# Patient Record
Sex: Female | Born: 1945 | Hispanic: No | Marital: Single
Health system: Midwestern US, Academic
[De-identification: ages and names within clinical notes are randomized; demographics above are authoritative.]

---

## 2019-01-16 IMAGING — CT CT ABDOMEN AND PELVIS WITHOUT CONTRAST
2 of 3 series · 16 of 46 positions shown, 18 images · non-contrast
Comparison: There are no previous exams available for comparison.

CT ABDOMEN AND PELVIS WITHOUT CONTRAST, 01/16/2019 [DATE]: 
CLINICAL INDICATION:  Abdominal bloating. Abdominal pain. Bladder cancer 
diagnosed 6630. Removed tumor. Radiation and chemotherapy. Hysterectomy. COPD. 
A search for DICOM formatted images was conducted for prior CT imaging studies 
completed at a non-affiliated media free facility.
TECHNIQUE: The abdomen and pelvis were scanned from lung bases through the 
pubic rami without contrast on a high-resolution CT scanner using dose reduction 
techniques..  Routine MPR reconstructions were performed.

[Series 2: abd/pel ax wo · axial · 0.88mm/px · z∈[-497,-98]mm · 13 of 153 slices shown, 15 images]
[im 10/153  soft-tissue]
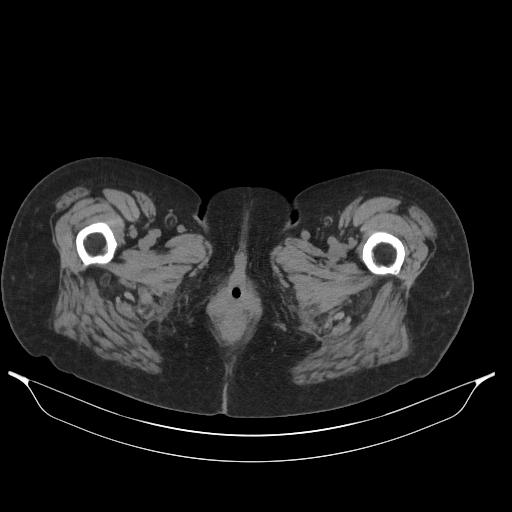
[im 10/153  bone]
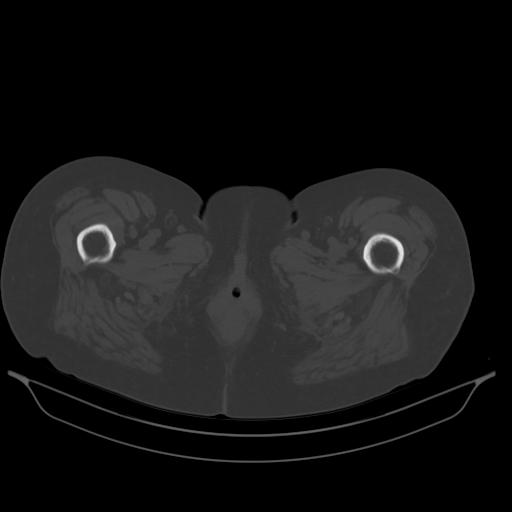
[im 20/153  soft-tissue]
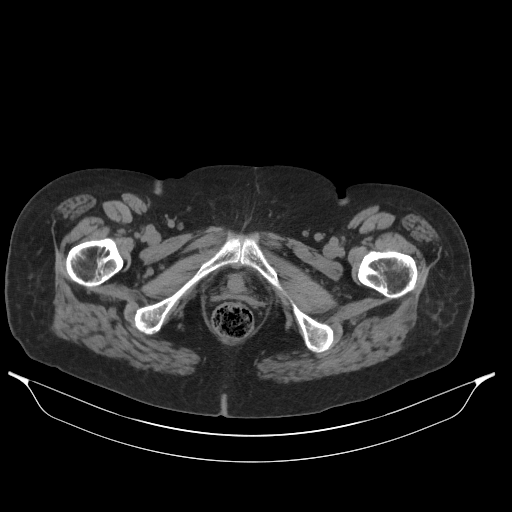
[im 30/153  soft-tissue]
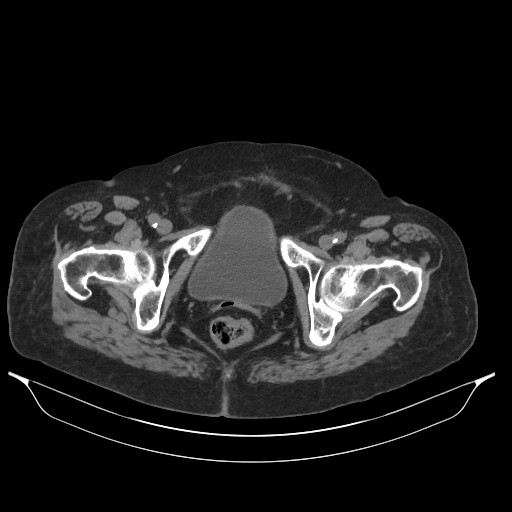
[im 45/153  soft-tissue]
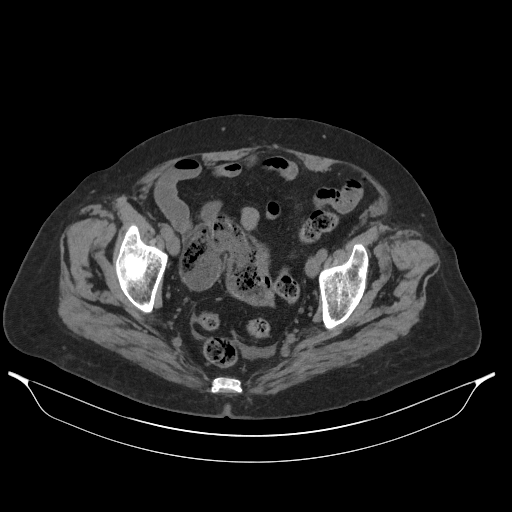
[im 54/153  soft-tissue]
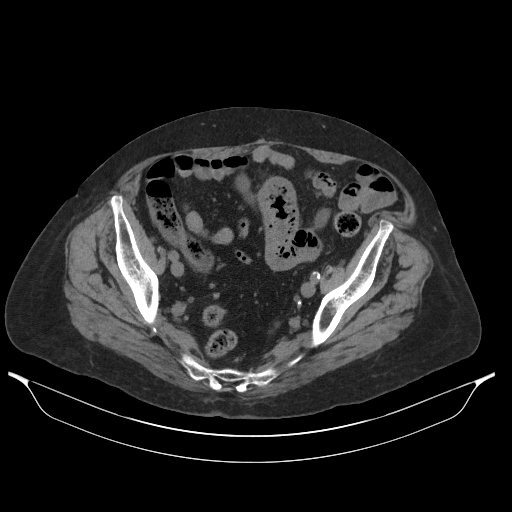
[im 64/153  soft-tissue]
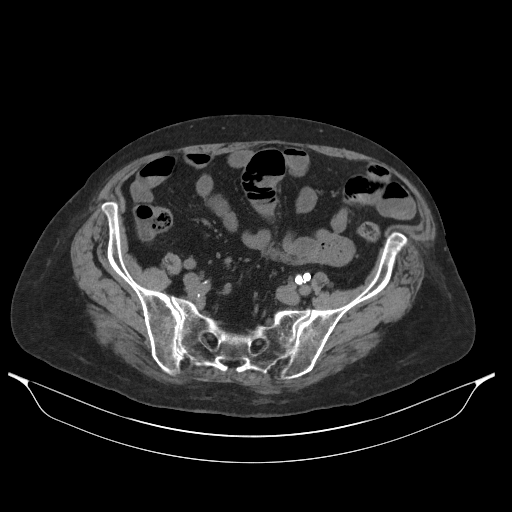
[im 79/153  soft-tissue]
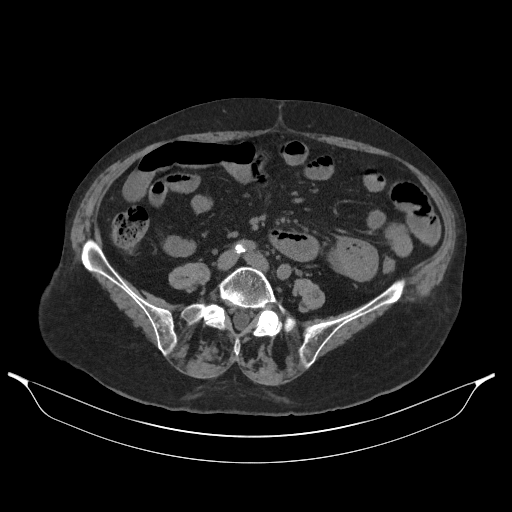
[im 89/153  soft-tissue]
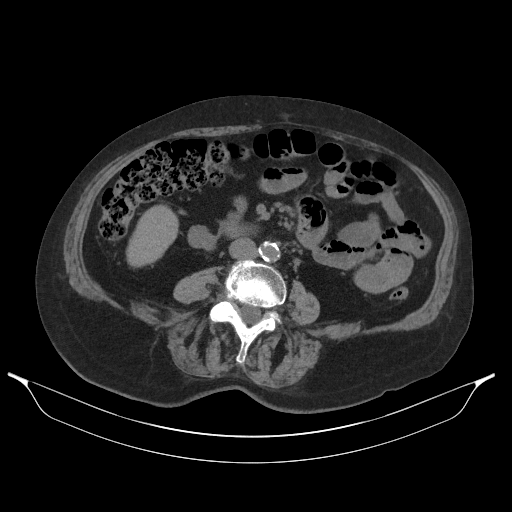
[im 99/153  soft-tissue]
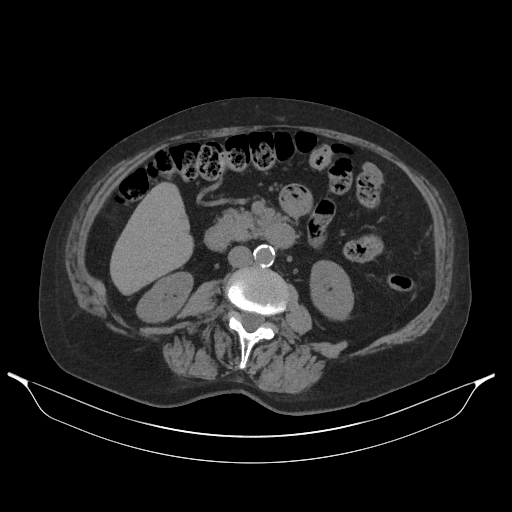
[im 99/153  bone]
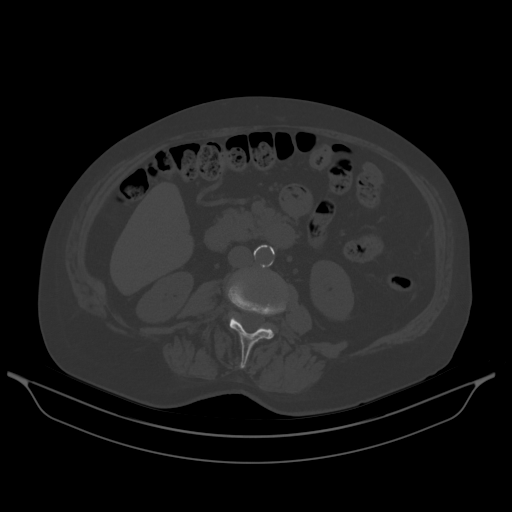
[im 108/153  soft-tissue]
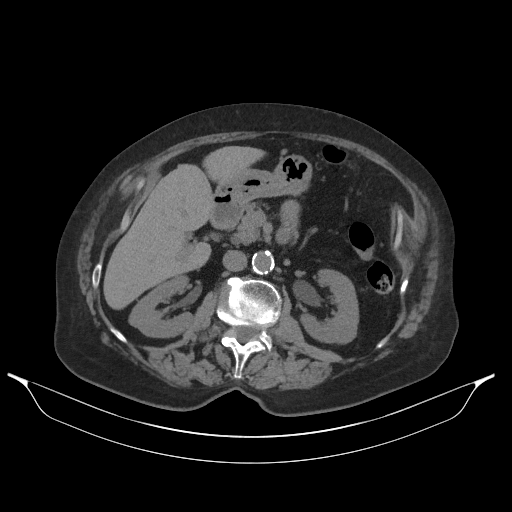
[im 123/153  soft-tissue]
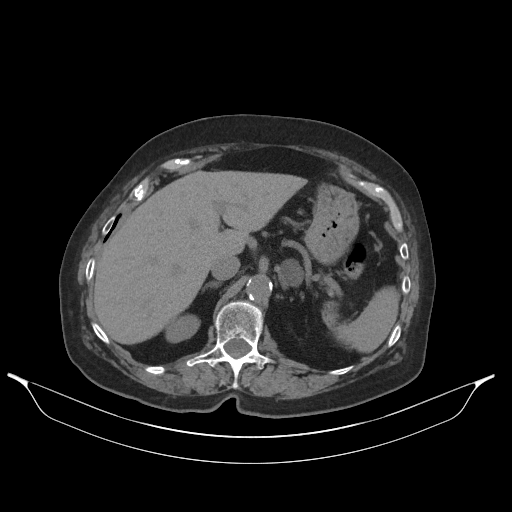
[im 133/153  soft-tissue]
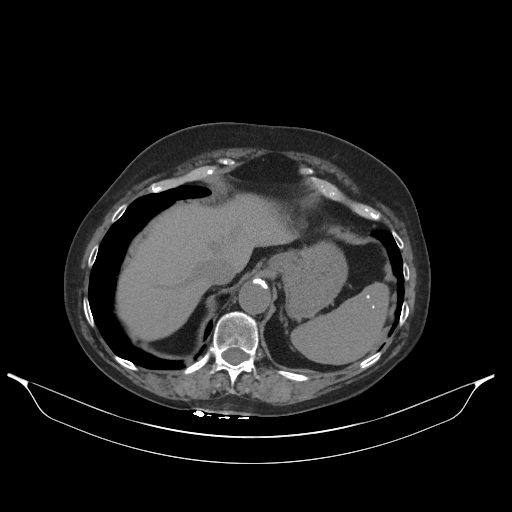
[im 143/153  soft-tissue]
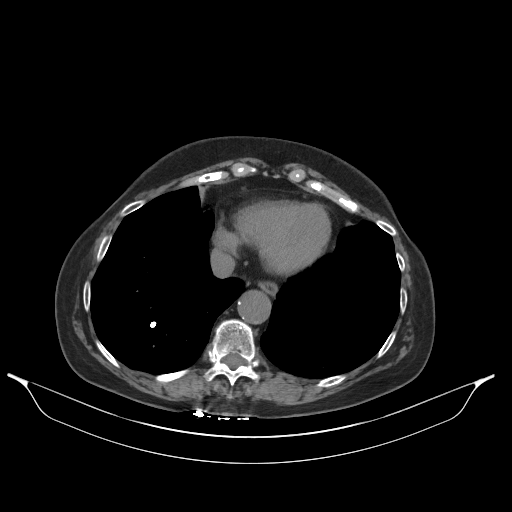

[Series 3: abd/pel cor wo · coronal · 0.88mm/px · 3 of 133 slices shown]
[im 45/133  soft-tissue]
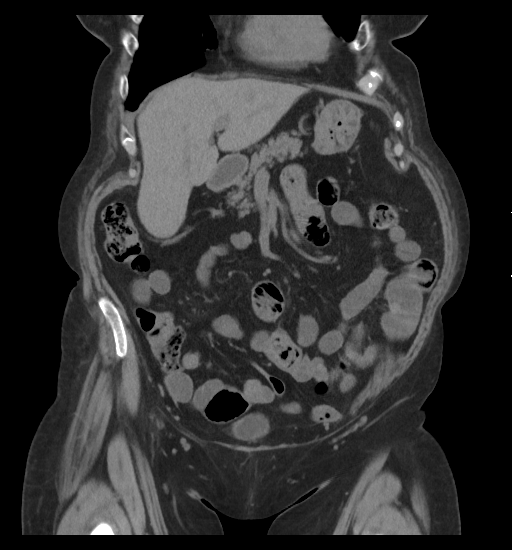
[im 59/133  soft-tissue]
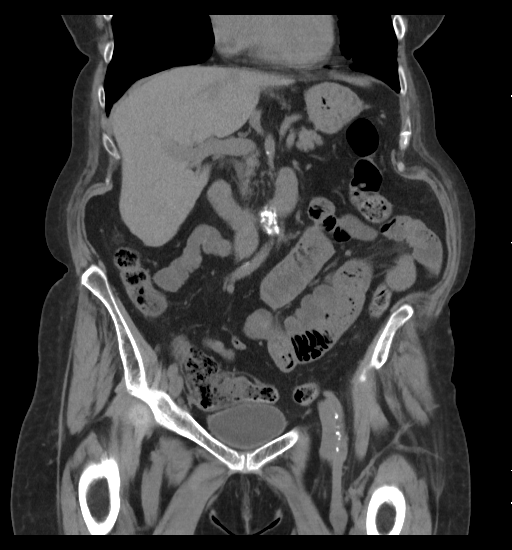
[im 74/133  soft-tissue]
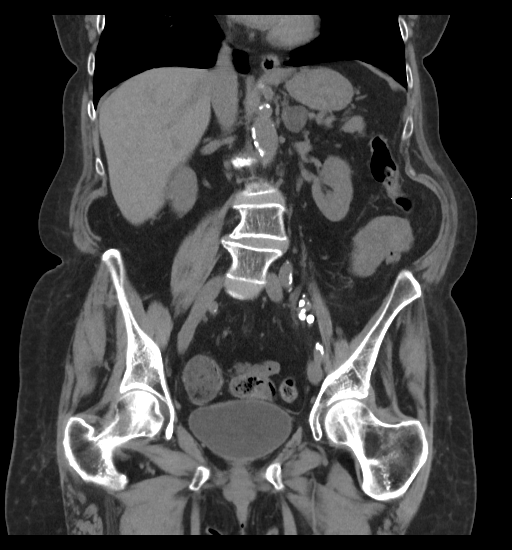

[16 of 46 positions shown; findings below may reference images not displayed]

FINDINGS: Lower lungs are mildly emphysematous with dependent atelectasis in 
the lower lobes. Heart size normal. Small hiatal hernia. Cholecystectomy. Right 
lobe of liver measures 14 cm craniocaudad. Spleen measures 10 cm craniocaudad 
with 
Calcifications. The pancreas. Calcified aorta is not dilated. Kidneys are normal 
in size no hydronephrosis. 
Trace ascites in the pelvis. Moderate stool throughout the colon, constipation. 
Mildly distended mid small bowel, nonspecific. Appendix is not identified. 
Bladder distends normally. Bladder wall appears unremarkable. 
Levoscoliosis. Mild degenerative change in lumbar spine. No disc herniation or 
spinal stenosis.
IMPRESSION: Bladder appears normal. Trace ascites in the pelvis. 
Mild constipation. 
Mildly distended mid small bowel, nonspecific. This could represent peristalsis. 
COPD. 
RADIATION DOSE REDUCTION: All CT scans are performed using radiation dose 
reduction techniques, when applicable.  Technical factors are evaluated and 
adjusted to ensure appropriate moderation of exposure.  Automated dose 
management technology is applied to adjust the radiation doses to minimize 
exposure while achieving diagnostic quality images.

## 2019-08-23 IMAGING — MR MRI THORACIC SPINE WITHOUT CONTRAST
5 of 8 series · 18 of 48 positions shown · IV contrast (gadolinium)
Comparison: None.

MRI THORACIC SPINE WITHOUT CONTRAST, 08/23/2019 [DATE]: 
CLINICAL INDICATION: Chronic upper back pain and bilateral radiculopathy since 
1306
TECHNIQUE: Multiplanar, multiecho position MR images of the thoracic spine were 
performed without intravenous gadolinium enhancement.

[Series 701: survey · axial · 10.0mm · 1.67mm/px · z∈[-129,+106]mm · 3 of 15 slices shown]
[im 1/15]
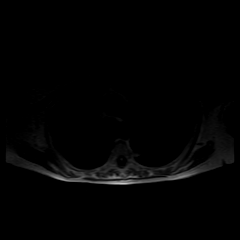
[im 8/15]
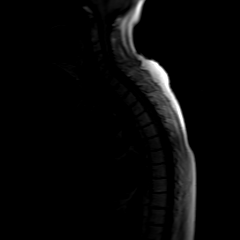
[im 15/15]
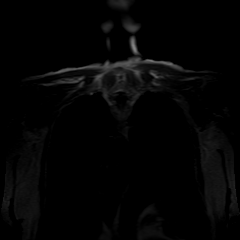

[Series 901: T1 · sagittal · 5.5mm · 0.66mm/px · 3 of 15 slices shown (1 of 2)]
[im 1/15]
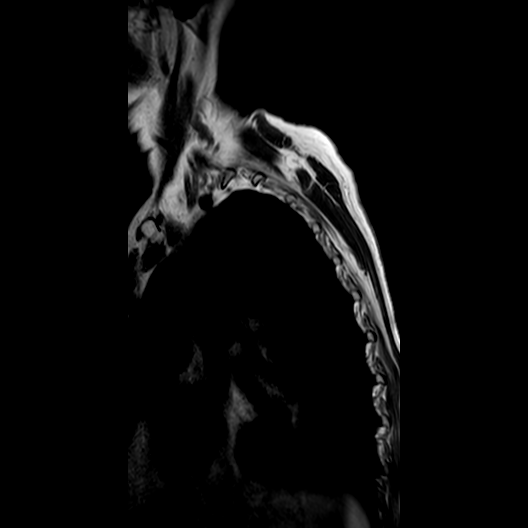
[im 8/15]
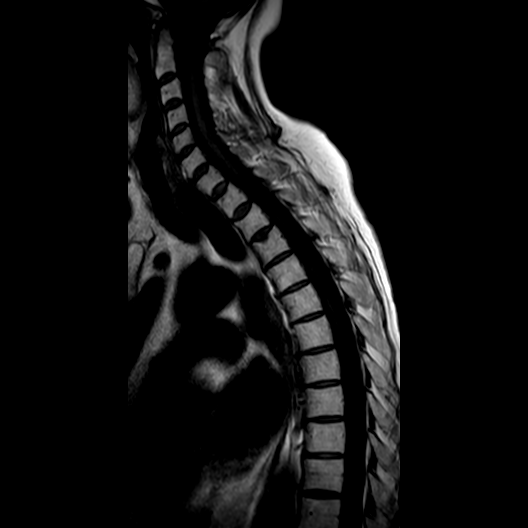
[im 15/15]
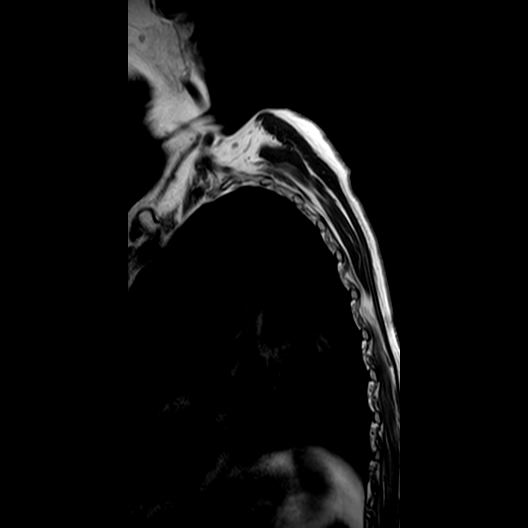

[Series 903: T1 · sagittal · 5.5mm · 0.66mm/px · 4 of 15 slices shown (2 of 2)]
[im 1/15]
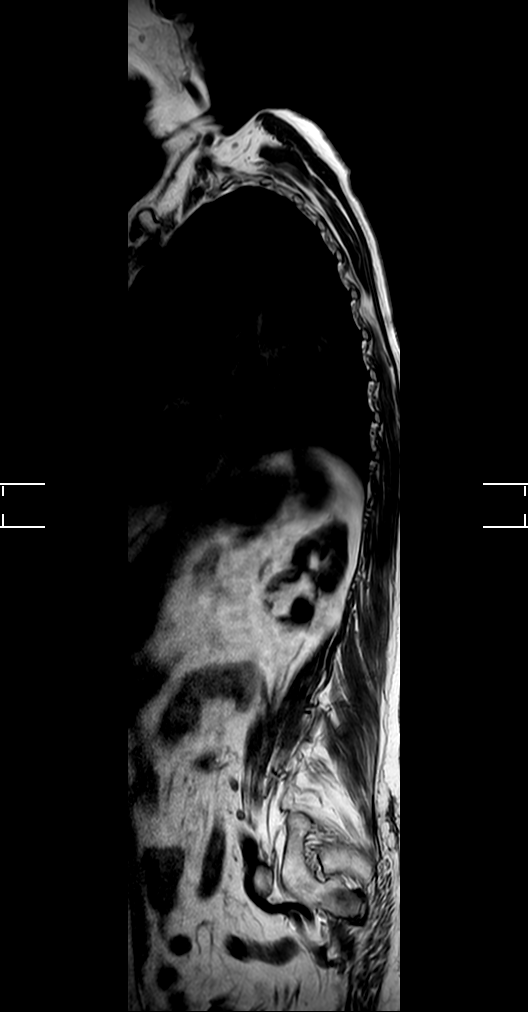
[im 5/15]
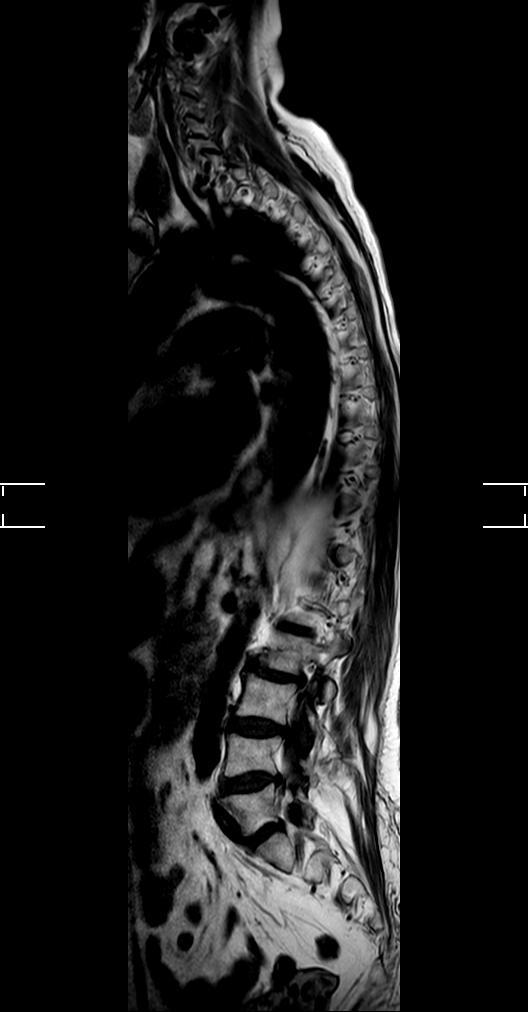
[im 10/15]
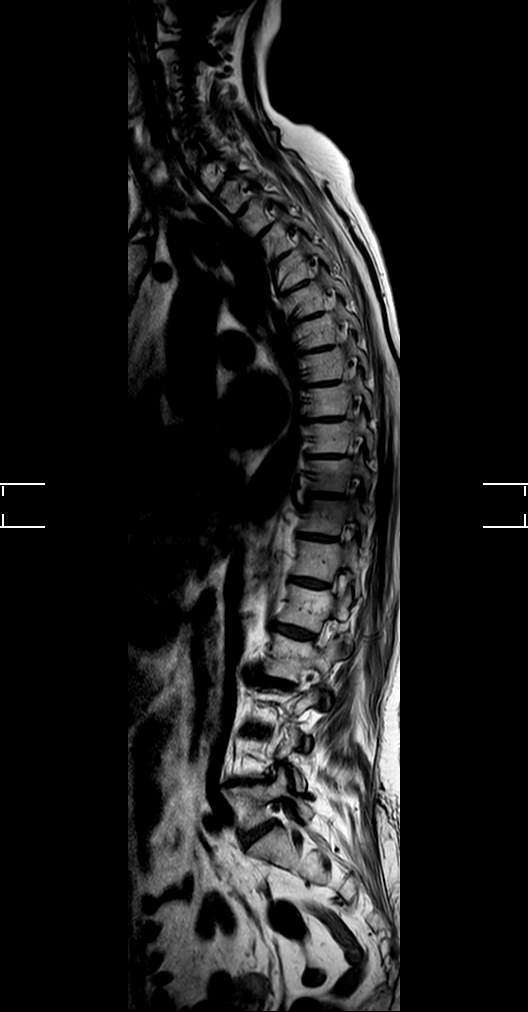
[im 15/15]
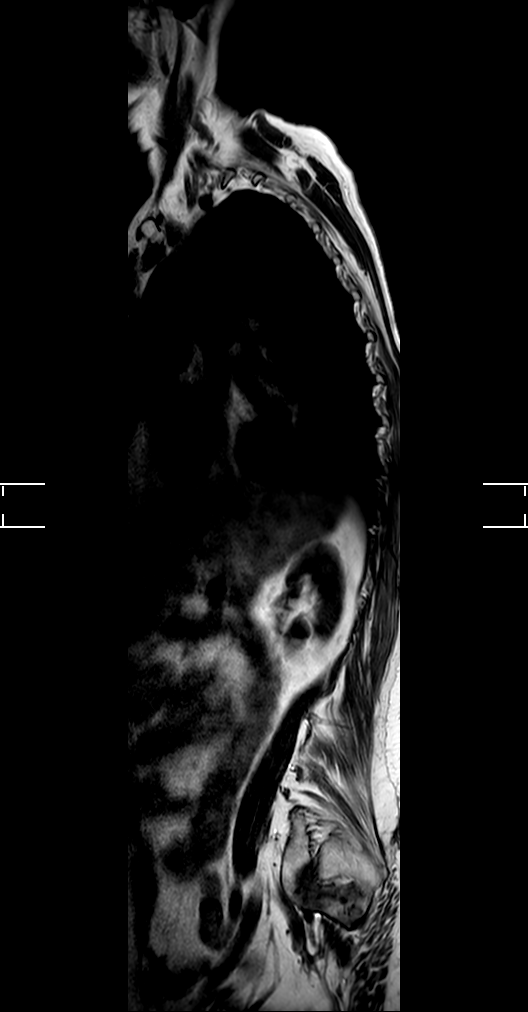

[Series 1001: t1_tse_sag · sagittal · 3.0mm · 0.62mm/px · 6 of 21 slices shown]
[im 1/21]
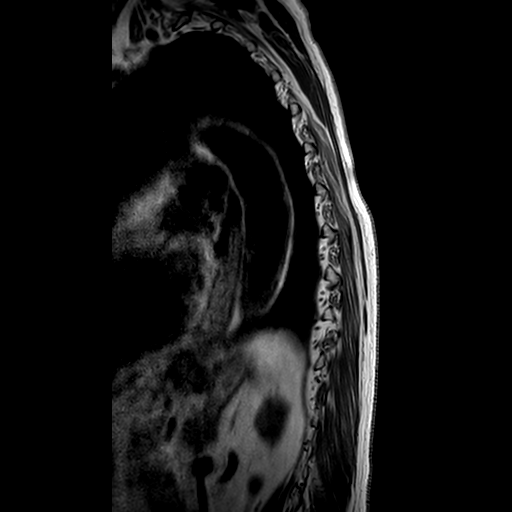
[im 5/21]
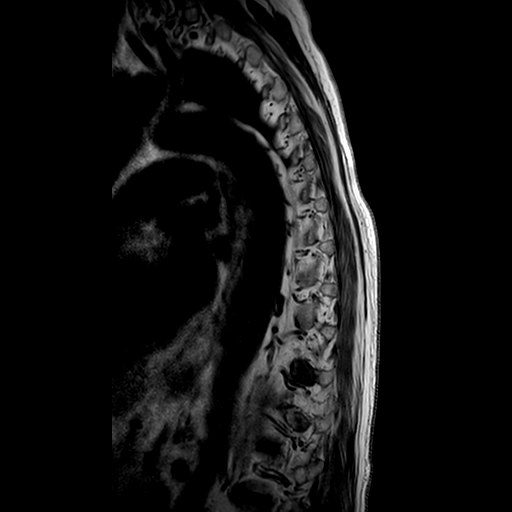
[im 9/21]
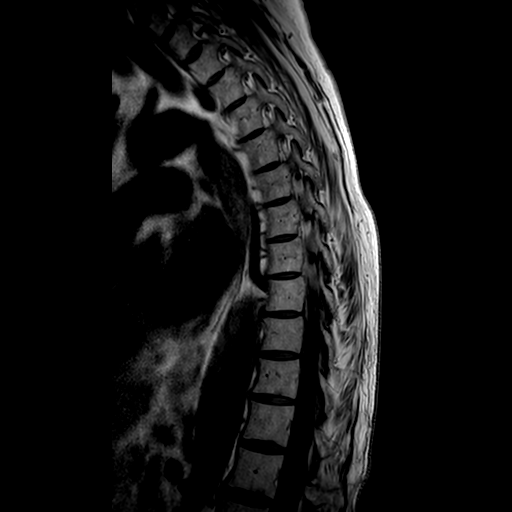
[im 13/21]
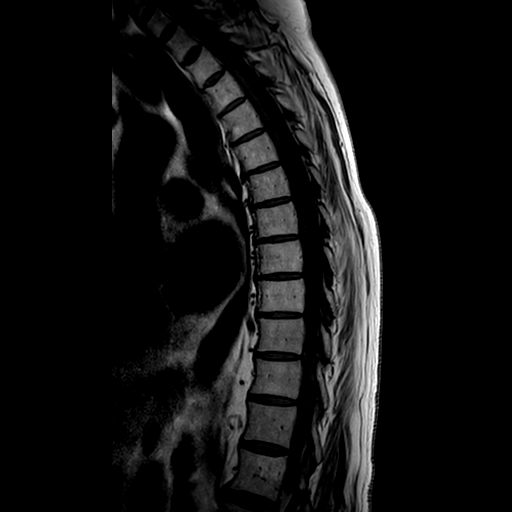
[im 17/21]
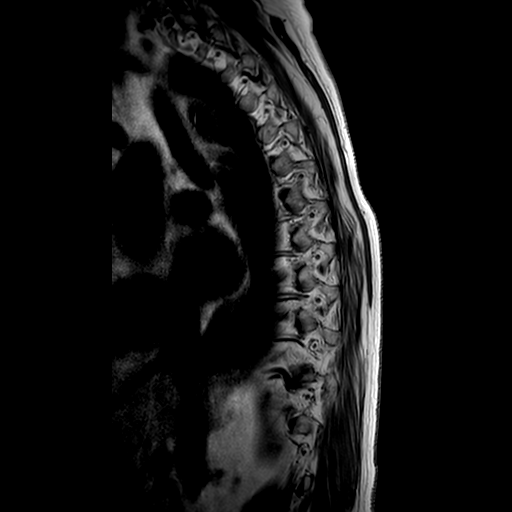
[im 21/21]
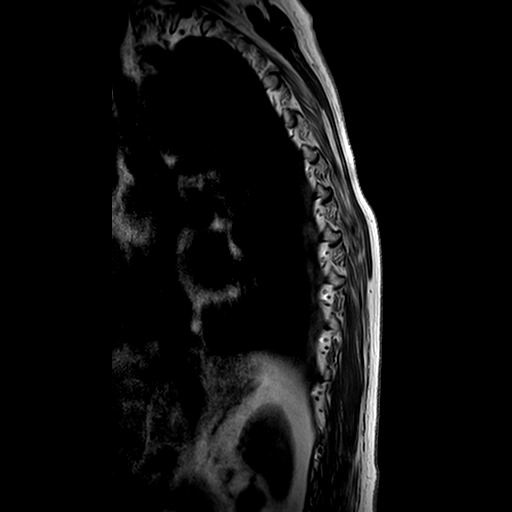

[Series 1101: t2_tse_sag · sagittal · 3.0mm · 0.62mm/px · 2 of 21 slices shown]
[im 1/21]
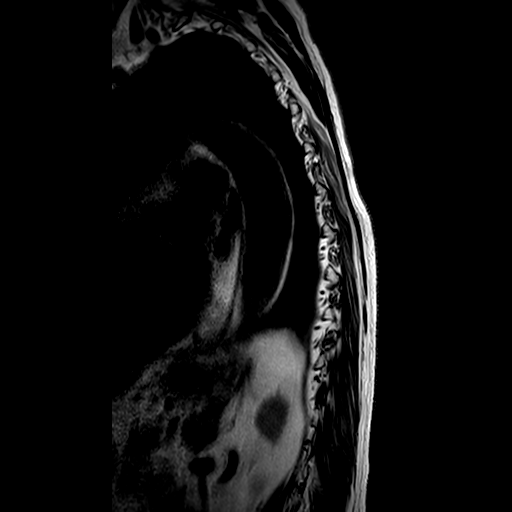
[im 5/21]
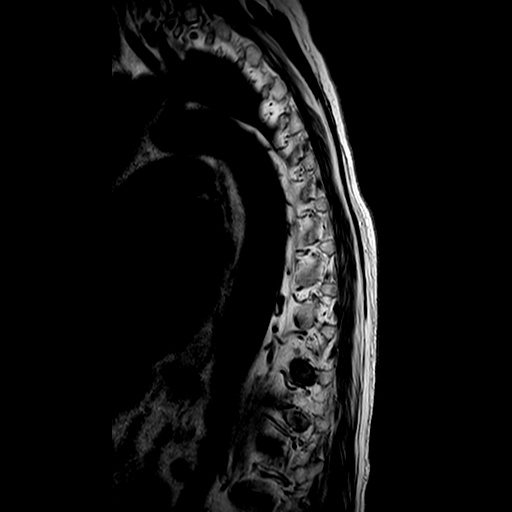

[18 of 48 positions shown; findings below may reference images not displayed]

FINDINGS: VERTEBRA: No acute fractures or marrow replacing lesions. Mild concavity of the 
T1 and T2 vertebral body endplates and mild chronic T3 superior endplate 
compression fracture/Schmorl's node. Mild facet arthropathy at T9-10, T10-11 and 
T11-12. L1 vertebral body hemangioma. No central canal, lateral recess or neural 
foraminal stenosis. 
DISCS: Mild multilevel thoracic degenerative disc disease. Mild T9-T10 disc 
bulge. No disc protrusions or evidence of discitis. 
SPINAL CORD: Spinal cord is normal in caliber and signal intensity. Conus 
medullaris is at the level of L1. 
OTHER: Localizer view demonstrates C6-7 disc extrusion superimposed upon disc 
bulge. Multilevel degenerative change of the lumbar spine.
IMPRESSION: 1. Mild multilevel thoracic degenerative disc disease. 
2. C6-7 disc extrusion superimposed upon disc bulge: Please refer to 08/23/2019 
cervical spine MRI. 
3. Multilevel degenerative change of the lumbar spine: Please refer to 08/24/2019 
MRI lumbar spine dictation.

## 2019-08-23 IMAGING — MR MRI CERVICAL SPINE WITHOUT CONTRAST
5 of 8 series · 23 of 48 positions shown · IV contrast (gadolinium)
Comparison: None

MRI CERVICAL SPINE WITHOUT CONTRAST, 08/23/2019 [DATE]: 
CLINICAL INDICATION: Neck pain, bilateral radiculopathy
TECHNIQUE: Sagittal T1, Sagittal T2, Sagittal STIR, Axial TSE and Axial 1522B 
images of the cervical spine were performed without intravenous gadolinium 
enhancement.

[Series 101: survey* · axial · 10.0mm · 1.56mm/px · z∈[-30,+199]mm · 5 of 15 slices shown]
[im 1/15]
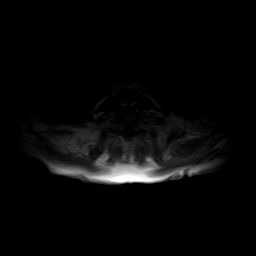
[im 4/15]
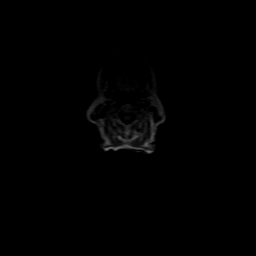
[im 8/15]
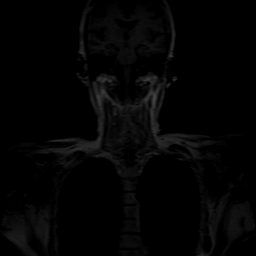
[im 11/15]
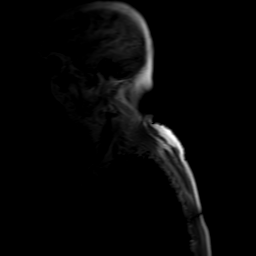
[im 15/15]
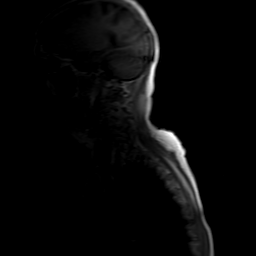

[Series 201: t2w_cor-surv · coronal · 5.0mm · 0.85mm/px · 3 of 7 slices shown]
[im 1/7]
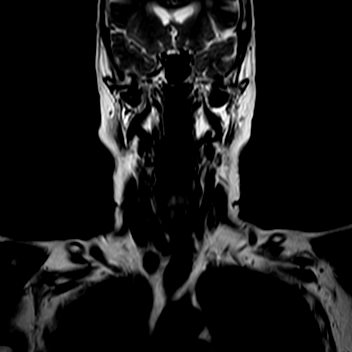
[im 4/7]
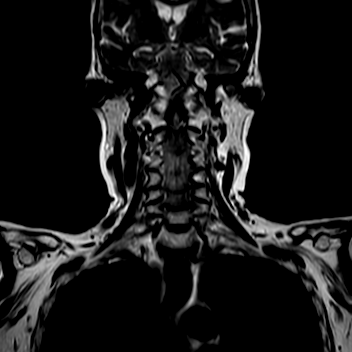
[im 7/7]
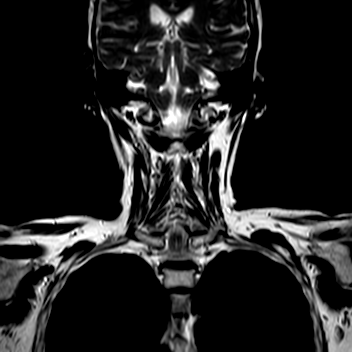

[Series 301: t1_sag · sagittal · 3.0mm · 0.34mm/px · 6 of 15 slices shown]
[im 1/15]
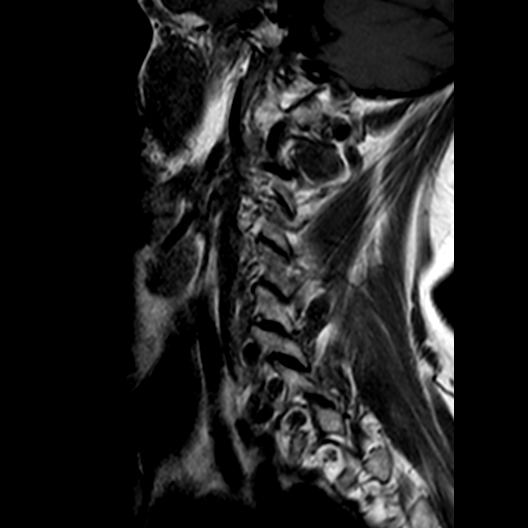
[im 3/15]
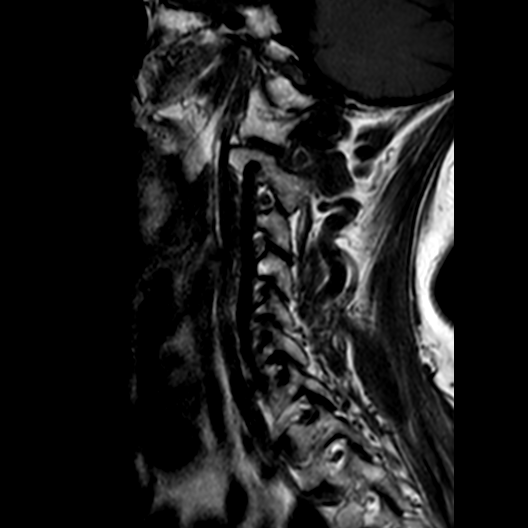
[im 6/15]
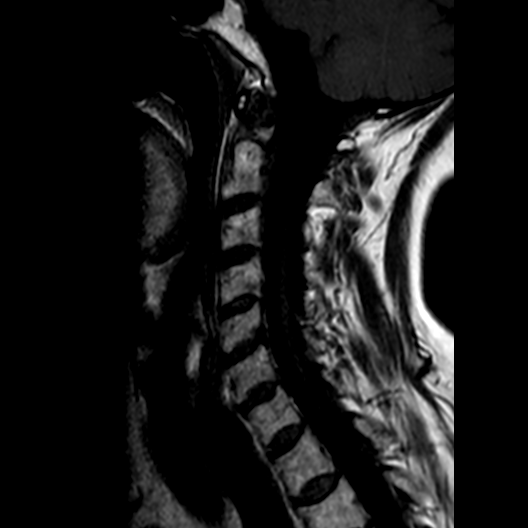
[im 9/15]
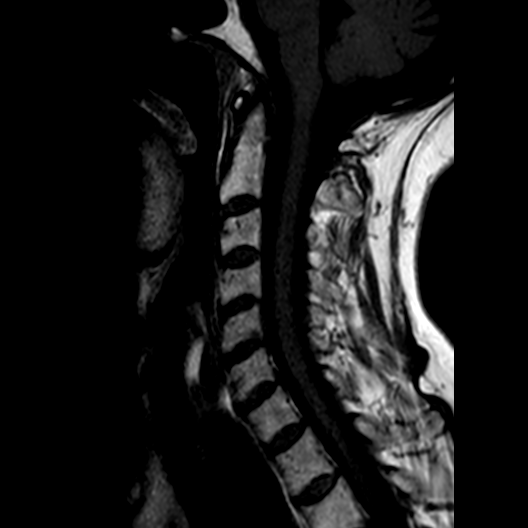
[im 12/15]
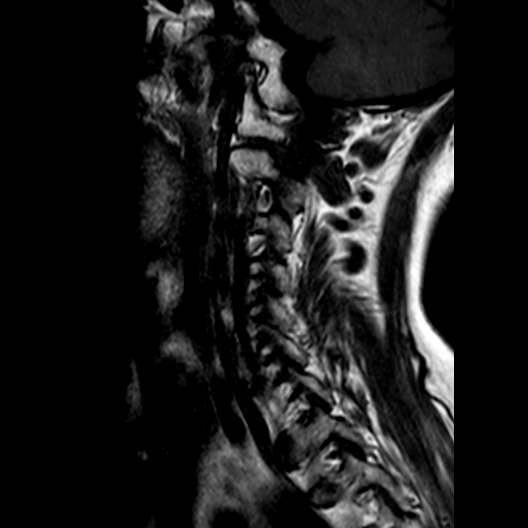
[im 15/15]
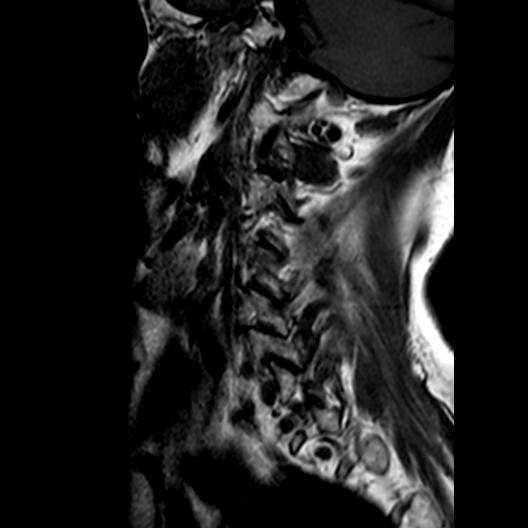

[Series 401: t2_sag · sagittal · 3.0mm · 0.35mm/px · 3 of 15 slices shown]
[im 1/15]
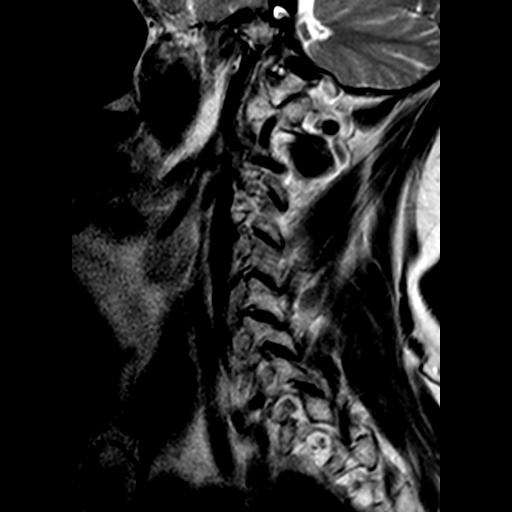
[im 3/15]
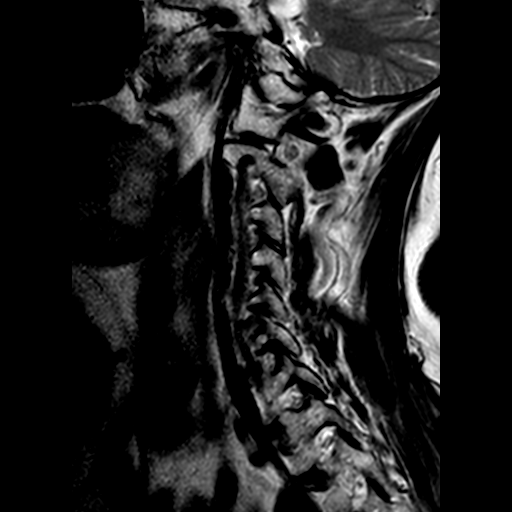
[im 6/15]
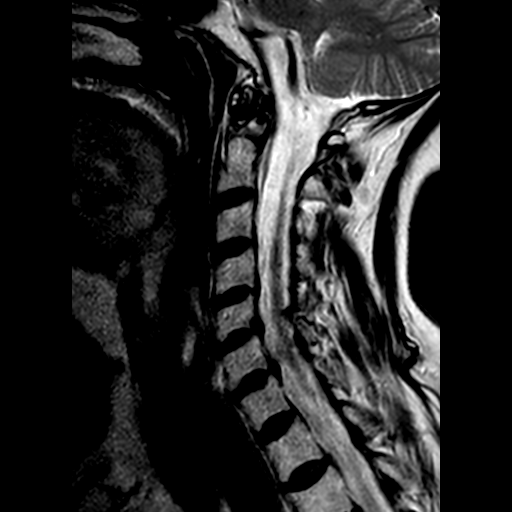

[Series 902: T1 · sagittal · 5.5mm · 0.66mm/px · 6 of 15 slices shown]
[im 1/15]
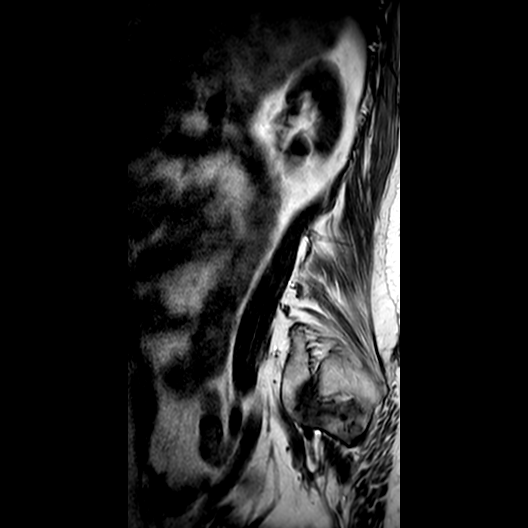
[im 3/15]
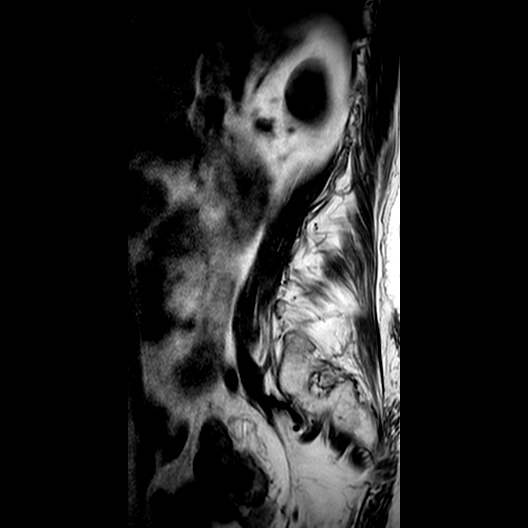
[im 6/15]
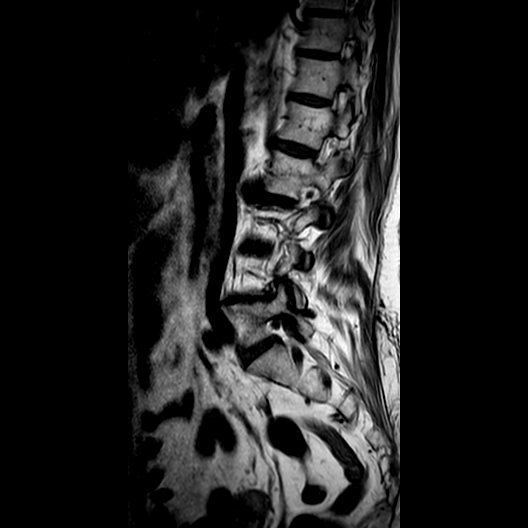
[im 9/15]
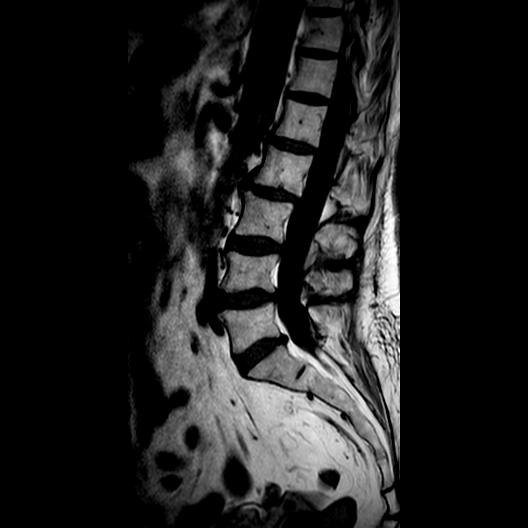
[im 12/15]
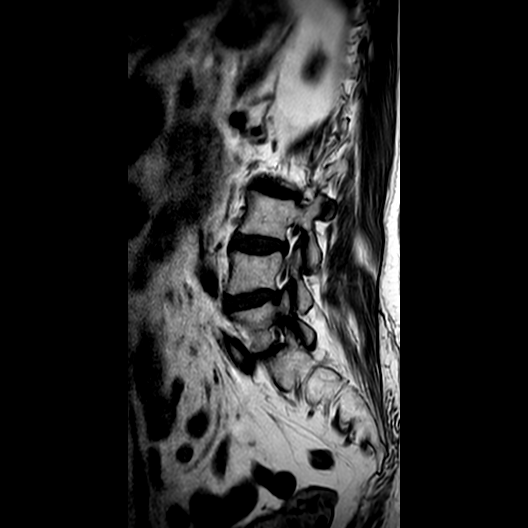
[im 15/15]
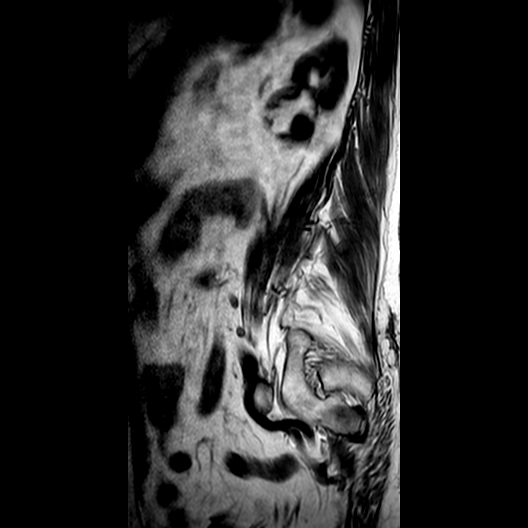

[23 of 48 positions shown; findings below may reference images not displayed]

FINDINGS: Cervical vertebral heights are intact. There is spondylosis, with disc 
hydration throughout the cervical spine. There is mild to moderate disc 
narrowing along the posterior interspaces from C4 through C7. Upper cervical 
lordosis is straightened. 
Cord signal is normal. The craniocervical junction is open. The dens and 
atlanto-dental interval are intact. There are atlantoaxial degenerative changes. 
Lower posterior fossa contents are unremarkable. There is no evidence for recent 
fracture or spinal malignancy. There are zygapophyseal facet degenerative 
changes. 
Axial images from C2-C3 through C7-T1 show the canal and foramina to be open at 
C2-3. 
At C3-4 the canal and foramina are open. 
At C4-5 the canal is open. There is mild bilateral foraminal stenosis seen on 
axial image 18. 
At C5-6 there is minimal disc bulge not touching the cord. The canal is open. 
There is moderate to marked left, moderate right foraminal stenosis, axial image 
14. 
At C6-C7 there is broad-based disc bulge with component of subligamentous 
extrusion seen on sagittal T2 image 8, axial T2 image 9. This mildly effaces the 
ventral thecal sac without cord contact. Mid sagittal canal diameter is 11 mm, 
toward the lower range of normal. There is mild to moderate left, mild right 
foraminal stenosis, axial image 10. 
At C7-T1 the canal and foramina are open..
IMPRESSION: Broad-based disc bulge/extrusion at C6-7 does not touch the cord, potential 
source of discogenic pain. There is borderline canal stenosis at this level and 
left greater than right foraminal stenosis. 
No other significant disc abnormality. There is foraminal narrowing at C5-6, 
moderate to marked on the left, moderate on the right, appearing to impinge the 
exiting C6 nerve root; clinical correlation. 
Cord signal is normal. There is no fracture or malignancy. No subluxation. 
Straightening of upper cervical lordosis could indicate muscle spasm.

## 2019-08-24 IMAGING — MR MRI LUMBAR SPINE WITHOUT CONTRAST
4 of 7 series · 20 of 48 positions shown · IV contrast (gadolinium)
Comparison: 01/16/2019 CT abdomen/pelvis

MRI LUMBAR SPINE WITHOUT CONTRAST, 08/24/2019 [DATE]: 
CLINICAL INDICATION: Chronic pain with radiculopathy since 8369. No new 
injuries. History of bladder cancer.
TECHNIQUE: Multiplanar, multiecho position MR images of the lumbar spine were 
performed without intravenous gadolinium enhancement.

[Series 101: survey · axial · 10.0mm · 1.39mm/px · z∈[-33,+201]mm · 4 of 10 slices shown]
[im 1/10]
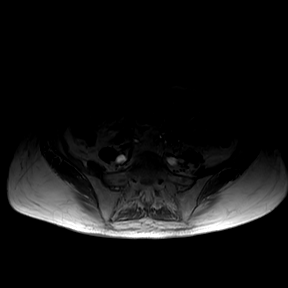
[im 4/10]
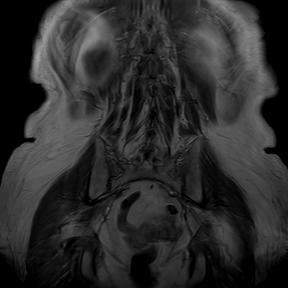
[im 7/10]
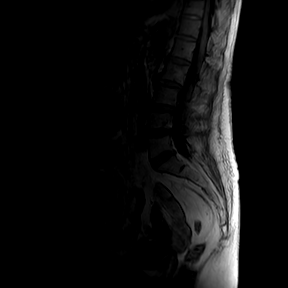
[im 10/10]
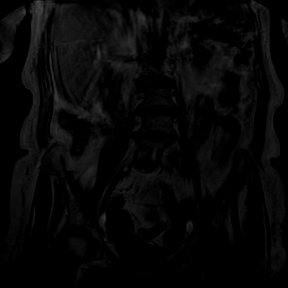

[Series 201: t2w_cor-surv · coronal · 6.0mm · 0.60mm/px · 2 of 5 slices shown]
[im 1/5]
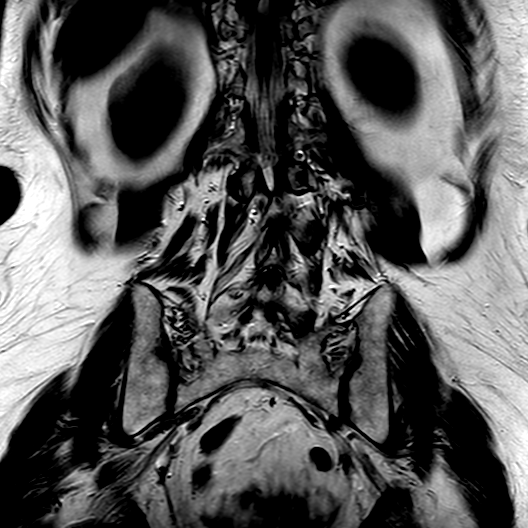
[im 5/5]
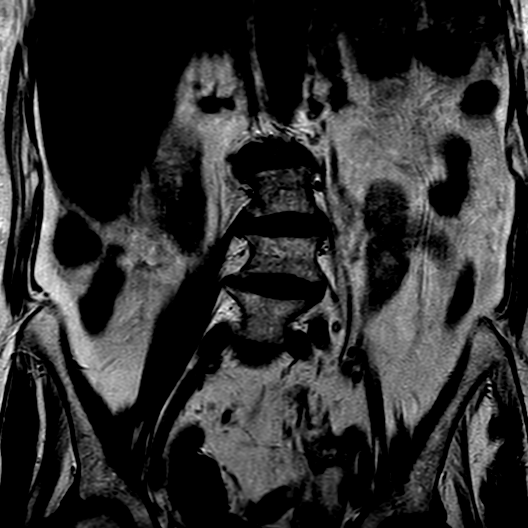

[Series 301: t1_tse_sag · sagittal · 4.0mm · 0.48mm/px · 5 of 17 slices shown]
[im 1/17]
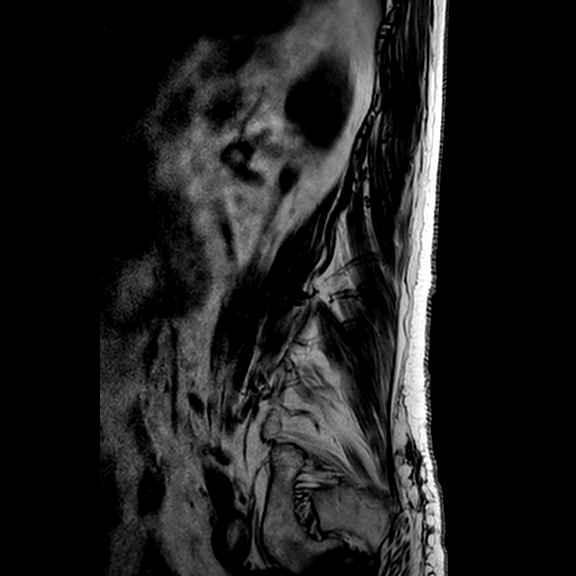
[im 4/17]
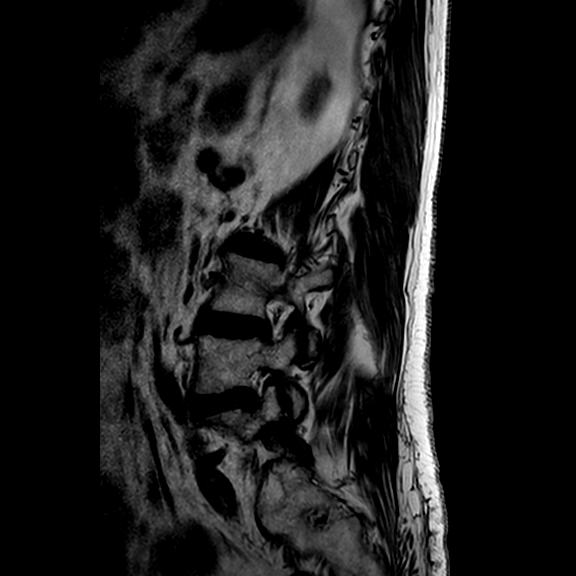
[im 7/17]
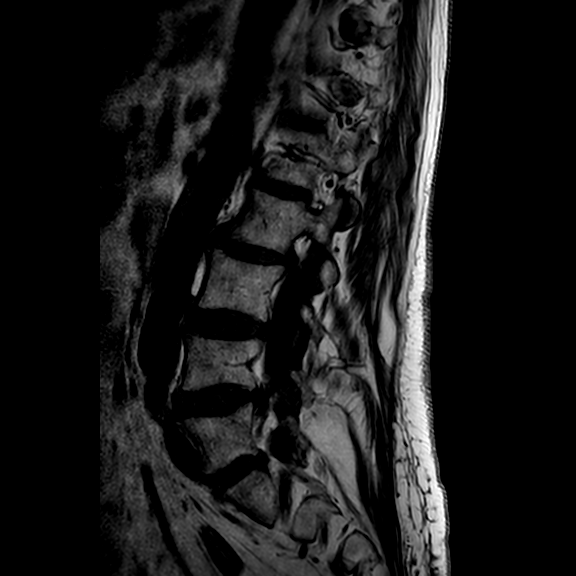
[im 10/17]
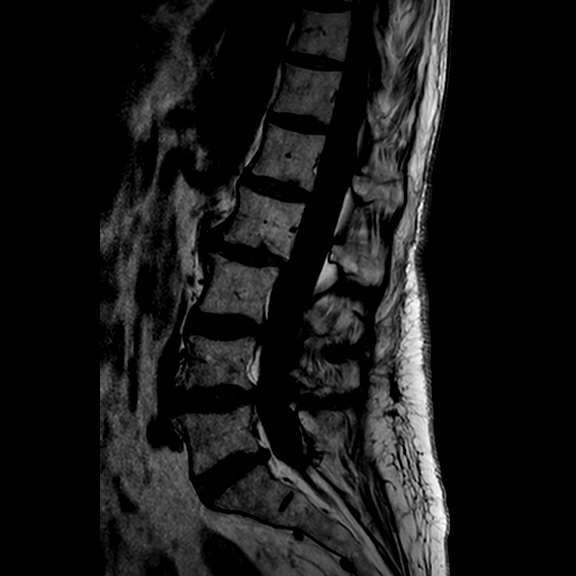
[im 17/17]
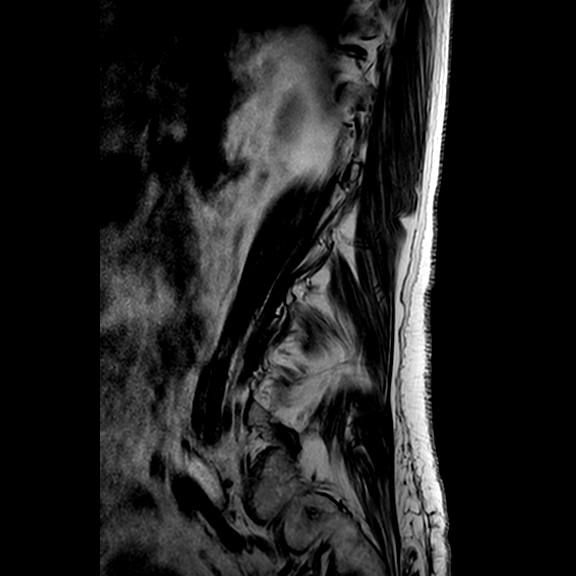

[Series 701: T1 · axial · 4.0mm · 0.38mm/px · z∈[-45,+152]mm · 9 of 35 slices shown]
[im 1/35]
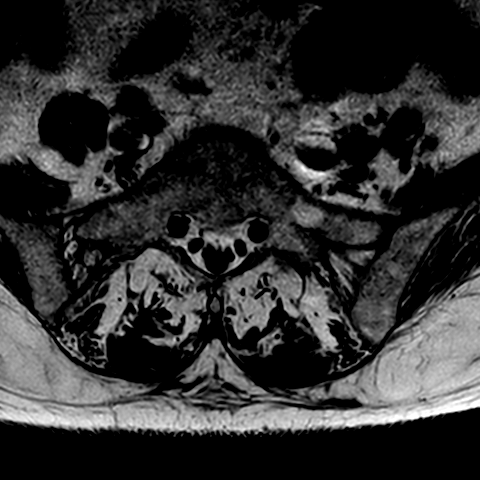
[im 6/35]
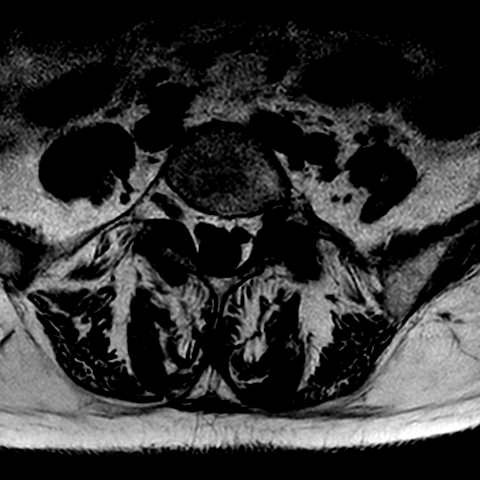
[im 12/35]
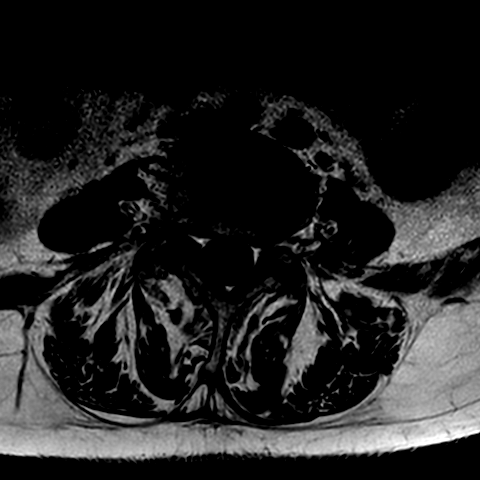
[im 15/35]
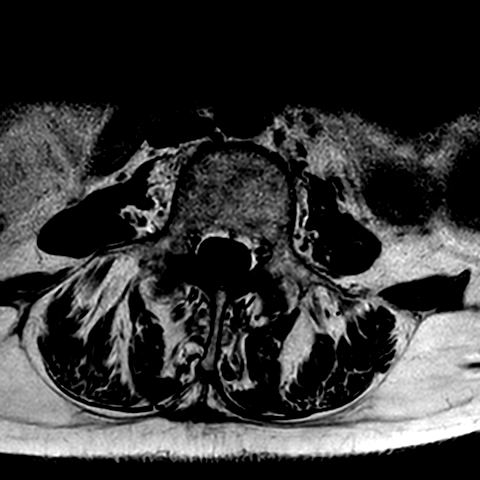
[im 18/35]
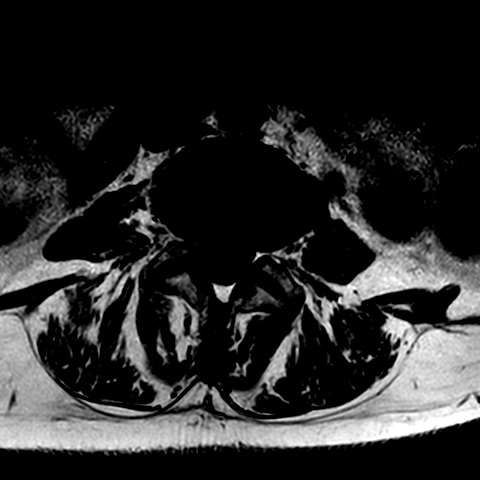
[im 20/35]
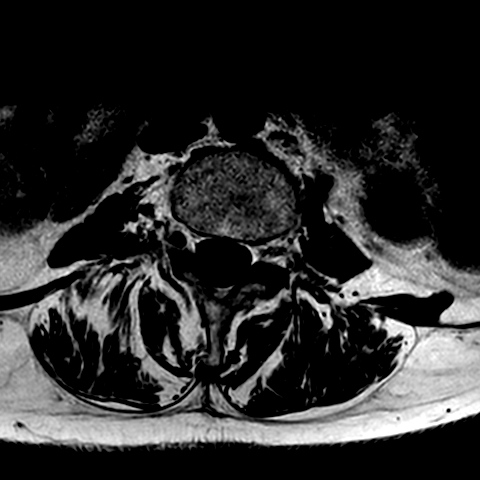
[im 23/35]
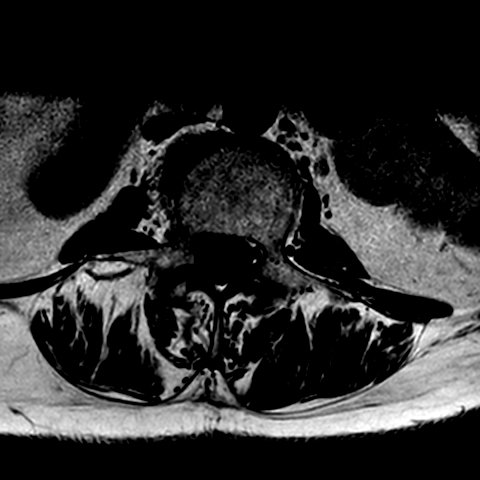
[im 29/35]
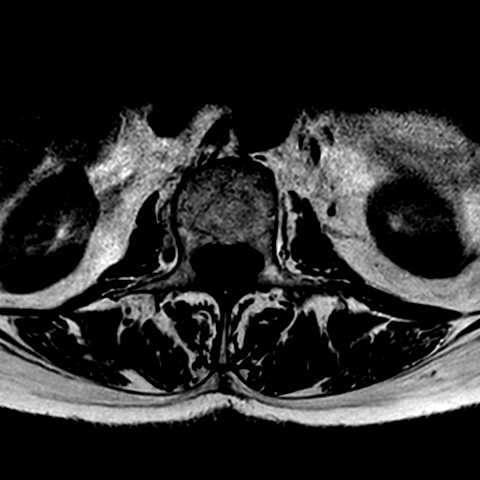
[im 35/35]
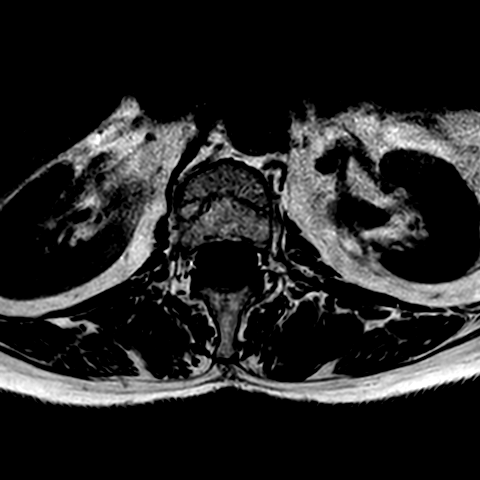

[20 of 48 positions shown; findings below may reference images not displayed]

FINDINGS: Nomenclature is based on 5 lumbar type vertebral bodies. The vertebral 
bodies are normal in height and alignment. No acute vertebral body fracture. No 
spondylolisthesis. Multilevel degenerative change and 15 degrees levocurve. The 
conus tip terminates at the L1 vertebral body level. The aorta is normal in 
diameter. The posterior paraspinal soft tissues are negative. 
T12-L1: The disc is normal in height and signal. No disc herniation. Normal 
facets. No spinal canal or neural foraminal stenosis. 
L1-L2: The disc is normal in height with disc desiccation. No disc herniation. 
Normal facets. No spinal canal or neural foraminal stenosis. 
L2-L3: Mild disc bulge, mild disc space narrowing and disc desiccation. No disc 
herniation. Normal facets. Mild ligamentum flavum hypertrophy. No spinal canal 
or neural foraminal stenosis. 
L3-L4: Mild disc bulge and disc desiccation.  No disc herniation. Normal facets. 
Mild ligamentum flavum hypertrophy. No spinal canal or neural foraminal 
stenosis. 
L4-L5: Mild disc bulge, moderate asymmetric disc space narrowing and 
desiccation. No disc herniation. Mild facet arthropathy and ligamentum flavum 
hypertrophy. No spinal canal stenosis. Mild bilateral lateral recess and neural 
foraminal stenosis. 
L5-S1: Small left subarticular disc protrusion superimposed upon broad-based 
disc bulge. Mild asymmetric disc space narrowing and disc desiccation. No 
central canal stenosis. Mild facet arthropathy. Mild left lateral recess and 
moderate left neural foraminal stenosis.
IMPRESSION: 1.  No metastases. 
2.  Multiple L5-S1 left subarticular disc protrusion superimposed upon 
broad-based disc bulge. 
3.  Multifocal degenerative change, mild levocurvature, mild L4-5 and L5-S1 
lateral recess stenosis, mild bilateral L4-5 and moderate left L5-S1 neural 
foraminal stenosis.

## 2022-07-11 DIAGNOSIS — I6350 Cerebral infarction due to unspecified occlusion or stenosis of unspecified cerebral artery: Secondary | ICD-10-CM

## 2022-07-11 DIAGNOSIS — I639 Cerebral infarction, unspecified: Secondary | ICD-10-CM

## 2022-07-11 DIAGNOSIS — I6381 Other cerebral infarction due to occlusion or stenosis of small artery: Secondary | ICD-10-CM

## 2022-07-11 NOTE — Nursing Note (Signed)
Pt arrived to unit room 5165 via stretcher. Called placed to Neuro to come see the pt. Pt arrived via ems from [facility] with belongings. Clothing in hospital bag and brown bag with brown purse and a pink cane

## 2022-07-11 NOTE — Plan of Care (Signed)
Problem: Safety  Goal: Patient will be injury free during hospitalization  Description: Assess and monitor vitals signs, neurological status including level of consciousness and orientation. Assess patient's risk for falls and implement fall prevention plan of care and interventions per hospital policy.      Ensure arm band on, uncluttered walking paths in room, adequate room lighting, call light and overbed table within reach, bed in low position, wheels locked, side rails up per policy, and non-skid footwear provided.   Outcome: Progressing  Goal: Patient with weight > 350lbs will have appropriate equipment  Description: Consider ordering Bariatric Bed, Chair and Bedside Commode for patient weight > 350 lbs.  Outcome: Progressing

## 2022-07-11 NOTE — Telephone Encounter (Signed)
Call from Genn[facility] Pac for a stroke that is longer than 24 hours    Dr. Sharia Reeve     77 year old female HTN, smoker, moved from [place] two years ago, was supposed to have stents placed in her legs who comes in with right sided weakness.      Falling and leaning to the right, started Monday of last week, got better for a little bit but now she can't walk and has been leaning to the right and is unable to get out of bed.     Not on any medications    CTH showed multiple lesions of hyperdensity vs hypodensity  CTA showed small hyperdensity    158/78, satting ok and HR fine  Labs have been fine.     Accepted to floor status  No beds at the moment.

## 2022-07-12 ENCOUNTER — Inpatient Hospital Stay
Admit: 2022-07-12 | Discharge: 2022-07-22 | Disposition: A | Discharge status: Rehabilitation Facility | Payer: Medicare (Managed Care) | Source: Other Acute Inpatient Hospital | Attending: Neurology | Admitting: Neurology

## 2022-07-12 ENCOUNTER — Inpatient Hospital Stay: Admit: 2022-07-12 | Payer: Medicare (Managed Care)

## 2022-07-12 DIAGNOSIS — I639 Cerebral infarction, unspecified: Secondary | ICD-10-CM

## 2022-07-12 LAB — COMPREHENSIVE METABOLIC PANEL
ALT: 10 U/L (ref 7–52)
AST: 13 U/L (ref 13–39)
Albumin: 3.8 g/dL (ref 3.5–5.7)
Alkaline Phosphatase: 103 U/L (ref 36–125)
Anion Gap: 10 mmol/L (ref 3–16)
BUN: 15 mg/dL (ref 7–25)
CO2: 31 mmol/L (ref 21–33)
Calcium: 9.1 mg/dL (ref 8.6–10.3)
Chloride: 98 mmol/L (ref 98–110)
Creatinine: 0.75 mg/dL (ref 0.60–1.30)
EGFR: 81
Glucose: 111 mg/dL — ABNORMAL HIGH (ref 70–100)
Osmolality, Calculated: 290 mOsm/kg (ref 278–305)
Potassium: 3 mmol/L — ABNORMAL LOW (ref 3.5–5.3)
Sodium: 139 mmol/L (ref 133–146)
Total Bilirubin: 0.3 mg/dL (ref 0.0–1.5)
Total Protein: 6 g/dL — ABNORMAL LOW (ref 6.4–8.9)

## 2022-07-12 LAB — CBC
Hematocrit: 39.3 % (ref 35.0–45.0)
Hemoglobin: 13.6 g/dL (ref 11.7–15.5)
MCH: 29.5 pg (ref 27.0–33.0)
MCHC: 34.7 g/dL (ref 32.0–36.0)
MCV: 85 fL (ref 80.0–100.0)
MPV: 9 fL (ref 7.5–11.5)
Platelets: 214 10*3/uL (ref 140–400)
RBC: 4.62 10*6/uL (ref 3.80–5.10)
RDW: 13.9 % (ref 11.0–15.0)
WBC: 5.7 10*3/uL (ref 3.8–10.8)

## 2022-07-12 LAB — LIPID PANEL
Cholesterol, Total: 182 mg/dL (ref 0–200)
HDL: 46 mg/dL — ABNORMAL LOW (ref 60–92)
LDL Cholesterol: 112 mg/dL
Non-HDL Cholesterol, Calculated: 136 mg/dL — ABNORMAL HIGH (ref 0–129)
Triglycerides: 122 mg/dL (ref 10–149)

## 2022-07-12 LAB — HEMOGLOBIN A1C: Hemoglobin A1C: 5.2 % (ref 4.0–5.6)

## 2022-07-12 LAB — HIGH SENSITIVITY TROPONIN: High Sensitivity Troponin: 8 ng/L (ref 0–14)

## 2022-07-12 MED ORDER — atorvastatin (LIPITOR) tablet 80 mg
80 | Freq: Every day | ORAL
Start: 2022-07-12 — End: 2022-07-12

## 2022-07-12 MED ORDER — atorvastatin (LIPITOR) tablet 80 mg
80 | Freq: Every evening | ORAL
Start: 2022-07-12 — End: 2022-07-22
  Administered 2022-07-12 – 2022-07-22 (×11): 80 mg via ORAL

## 2022-07-12 MED ORDER — ergocalciferol capsule 50,000 Units
1250 | ORAL
Start: 2022-07-12 — End: 2022-07-22
  Administered 2022-07-12 – 2022-07-19 (×2): 50000 [IU] via ORAL

## 2022-07-12 MED ORDER — potassium chloride (KLOR-CON M20) CR tablet 40 mEq
20 | Freq: Once | ORAL | Status: AC
Start: 2022-07-12 — End: 2022-07-12
  Administered 2022-07-12: 09:00:00 40 meq via ORAL

## 2022-07-12 MED ORDER — acetaminophen (TYLENOL) suppository 650 mg
650 | RECTAL | Status: AC | PRN
Start: 2022-07-12 — End: 2022-07-22

## 2022-07-12 MED ORDER — potassium chloride (KLOR-CON M20) CR tablet 40 mEq
20 | Freq: Once | ORAL | Status: AC
Start: 2022-07-12 — End: 2022-07-12
  Administered 2022-07-12: 15:00:00 40 meq via ORAL

## 2022-07-12 MED ORDER — nicotine (polacrilex) (NICORETTE) gum 2 mg
2 | BUCCAL | PRN
Start: 2022-07-12 — End: 2022-07-22
  Administered 2022-07-13: 13:00:00 2 mg via ORAL

## 2022-07-12 MED ORDER — metOLazone (ZAROXOLYN) tablet 5 mg
5 | Freq: Every day | ORAL
Start: 2022-07-12 — End: 2022-07-22
  Administered 2022-07-12 – 2022-07-22 (×11): 5 mg via ORAL

## 2022-07-12 MED ORDER — cyanocobalamin (VITAMIN B-12) tablet 1,000 mcg
1000 | Freq: Every day | ORAL
Start: 2022-07-12 — End: 2022-07-22
  Administered 2022-07-12 – 2022-07-22 (×11): 1000 ug via ORAL

## 2022-07-12 MED ORDER — pantoprazole (PROTONIX) EC tablet 40 mg
40 | Freq: Every morning | ORAL | Status: AC
Start: 2022-07-12 — End: 2022-07-22
  Administered 2022-07-12 – 2022-07-22 (×11): 40 mg via ORAL

## 2022-07-12 MED ORDER — sertraline (ZOLOFT) tablet 25 mg
50 | Freq: Every day | ORAL
Start: 2022-07-12 — End: 2022-07-16
  Administered 2022-07-12 – 2022-07-16 (×5): 25 mg via ORAL

## 2022-07-12 MED ORDER — acetaminophen (TYLENOL) tablet 650 mg
325 | ORAL | PRN
Start: 2022-07-12 — End: 2022-07-22
  Administered 2022-07-13 – 2022-07-22 (×22): 650 mg via ORAL

## 2022-07-12 MED ORDER — nicotine (NICODERM CQ) 14 mg/24 hr 1 patch
14 | Freq: Every day | TRANSDERMAL
Start: 2022-07-12 — End: 2022-07-22
  Administered 2022-07-12 – 2022-07-22 (×11): 1 via TRANSDERMAL

## 2022-07-12 MED ORDER — ED albuterol (PROVENTIL HFA) inhaler 2 puff
90 | RESPIRATORY_TRACT | Status: AC | PRN
Start: 2022-07-12 — End: 2022-07-22

## 2022-07-12 MED ORDER — lisinopriL (PRINIVIL) tablet 40 mg
40 | Freq: Every day | ORAL
Start: 2022-07-12 — End: 2022-07-12

## 2022-07-12 MED ORDER — heparin (porcine) injection 5,000 Units
5000 | Freq: Three times a day (TID) | INTRAMUSCULAR | Status: AC
Start: 2022-07-12 — End: 2022-07-22
  Administered 2022-07-12 – 2022-07-22 (×31): 5000 [IU] via SUBCUTANEOUS

## 2022-07-12 MED ORDER — gabapentin (NEURONTIN) capsule 200 mg
100 | Freq: Two times a day (BID) | ORAL | Status: AC
Start: 2022-07-12 — End: 2022-07-22
  Administered 2022-07-12 – 2022-07-22 (×21): 200 mg via ORAL

## 2022-07-12 MED ORDER — budesonide-formoteroL (SYMBICORT) 80-4.5 mcg/actuation inhaler 2 puff
80-4.5 | Freq: Two times a day (BID) | RESPIRATORY_TRACT
Start: 2022-07-12 — End: 2022-07-22
  Administered 2022-07-13 – 2022-07-22 (×14): 2 via RESPIRATORY_TRACT

## 2022-07-12 MED ORDER — albuterol (PROVENTIL) nebulizer solution 2.5 mg
2.5 | RESPIRATORY_TRACT | Status: AC | PRN
Start: 2022-07-12 — End: 2022-07-22

## 2022-07-12 MED ORDER — lisinopriL (PRINIVIL) tablet 20 mg
10 | Freq: Every day | ORAL | Status: AC
Start: 2022-07-12 — End: 2022-07-22
  Administered 2022-07-12 – 2022-07-17 (×6): 20 mg via ORAL

## 2022-07-12 MED ORDER — aspirin tablet 325 mg
325 | Freq: Every day | ORAL
Start: 2022-07-12 — End: 2022-07-12

## 2022-07-12 MED ORDER — aspirin chewable tablet 81 mg
81 | Freq: Every day | ORAL
Start: 2022-07-12 — End: 2022-07-22
  Administered 2022-07-12 – 2022-07-22 (×10): 81 mg via ORAL

## 2022-07-12 MED ORDER — ascorbic acid (vitamin C) (VITAMIN C) tablet 500 mg
500 | Freq: Every day | ORAL
Start: 2022-07-12 — End: 2022-07-22
  Administered 2022-07-12 – 2022-07-22 (×11): 500 mg via ORAL

## 2022-07-12 MED ORDER — ondansetron (ZOFRAN) injection 4 mg
4 | Freq: Four times a day (QID) | INTRAMUSCULAR | PRN
Start: 2022-07-12 — End: 2022-07-22

## 2022-07-12 MED FILL — GABAPENTIN 100 MG CAPSULE: 100 100 MG | ORAL | Qty: 2

## 2022-07-12 MED FILL — ERGOCALCIFEROL (VITAMIN D2) 1,250 MCG (50,000 UNIT) CAPSULE: 1250 1,250 mcg (50,000 unit) | ORAL | Qty: 1

## 2022-07-12 MED FILL — NICOTINE (POLACRILEX) 2 MG GUM: 2 2 mg | BUCCAL | Qty: 1

## 2022-07-12 MED FILL — POTASSIUM CHLORIDE ER 20 MEQ TABLET,EXTENDED RELEASE(PART/CRYST): 20 20 MEQ | ORAL | Qty: 2

## 2022-07-12 MED FILL — NICOTINE 14 MG/24 HR DAILY TRANSDERMAL PATCH: 14 14 mg/24 hr | TRANSDERMAL | Qty: 1

## 2022-07-12 MED FILL — ATORVASTATIN 80 MG TABLET: 80 80 MG | ORAL | Qty: 1

## 2022-07-12 MED FILL — VENTOLIN HFA 90 MCG/ACTUATION AEROSOL INHALER: 90 90 mcg/actuation | RESPIRATORY_TRACT | Qty: 8

## 2022-07-12 MED FILL — METOLAZONE 5 MG TABLET: 5 5 MG | ORAL | Qty: 1

## 2022-07-12 MED FILL — VITAMIN C 500 MG TABLET: 500 500 MG | ORAL | Qty: 1

## 2022-07-12 MED FILL — PANTOPRAZOLE 40 MG TABLET,DELAYED RELEASE: 40 40 MG | ORAL | Qty: 1

## 2022-07-12 MED FILL — HEPARIN (PORCINE) 5,000 UNIT/ML INJECTION SOLUTION: 5000 5,000 unit/mL | INTRAMUSCULAR | Qty: 1

## 2022-07-12 MED FILL — SYMBICORT 80 MCG-4.5 MCG/ACTUATION HFA AEROSOL INHALER: 80-4.5 80-4.5 mcg/actuation | RESPIRATORY_TRACT | Qty: 6.9

## 2022-07-12 MED FILL — SERTRALINE 50 MG TABLET: 50 50 MG | ORAL | Qty: 1

## 2022-07-12 MED FILL — ASPIRIN 81 MG CHEWABLE TABLET: 81 81 MG | ORAL | Qty: 1

## 2022-07-12 MED FILL — LISINOPRIL 10 MG TABLET: 10 10 MG | ORAL | Qty: 2

## 2022-07-12 MED FILL — VITAMIN B-12  1,000 MCG TABLET: 1000 1000 MCG | ORAL | Qty: 1
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
## 2022-07-12 NOTE — H&P (Signed)
[facility]  Department of Neurology and Rehab Medicine  Inpatient Neurology Service History and Physical Note    Background   Admitting Physician: Marchelle Folks, MD  Date of Admit: 07/11/2022  Chief Complaint: Right-sided weakness    Chief Complaint and History of the Present Illness     Michele Hopkins is a 77 y.o. 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     Patient was seen in the emergency department at [facility] yesterday for recent fall on Monday (4/8) without LOC. She reports sitting on the side of her bed when she suddenly felt unsteady, started leaning towards the right-side and fell onto the floor. She denies hitting her head or loss of consciousness. She reports feeling right-sided weakness (arm, hand and face) following the fall. She started to feel better shortly after the incident and did not seek medical care.     Yesterday morning (4/13), she sat down to drink coffee when she felt weird and could not stand up from a chair on her porch. She could not move the right side of her body and experienced some mild difficulty with swallowing and speaking at the time. She denies any blurry or loss of vision. She reports a previous history of stroke in the 1980's, but is unsure what her symptoms were at the time. She smokes 0.5-1 PPD for the past 20 years. She denies any alcohol or drug use. She reports being prescribed a daily aspirin, but has not been compliant with taking it regularly. She does not take any blood thinners.     In the emergency department of [facility], she was hemodynamically stable. Initial NIHSS score of 1. Labs notable for Na 136, K 3, CO2 29, Cr 0.88, WBC 5.3, Hgb 15, and Troponin <0.01. EKG normal sinus rhythm. CT head with several new small low-density areas within the brainstem,  could be acute or subacute infarcts versus artifact. CTA neck dense atheromatous calcification bilateral carotid bulbs and right proximal ICA, less than 50% reduction of origin of the right ICA, and 2.2 cm irregular area of nodular infiltrate or mass-like consolidation in the right apex of the lung. CTA head without large vessel stenosis or occlusion.     Review of Systems     Neurologic Review of Systems  Positive responses will be BOLDED, otherwise the patient or caregiver denies the symptom  Loss of consciousness, frequent confusion or disorientation, memory loss, word finding difficulty, language problems (paraphasic errors, inability to comprehend, inability to form words, writing difficulty, spelling difficulty, reading difficulty), delusions, hallucinations, or perseverations, sleep disturbances, vision obscurations or abnormal visual phenomena, vision loss or double vision, facial droop or eyelid droop, swallowing or chewing difficulty, increased or decreased secretions, hearing loss or hyperacusis, dizziness or vertigo, slurred or changed speech, neck weakness or stiffness, abnormal jerks or movements including tremors or postures, weakness in arms or legs, incoordination or ataxia of limbs, numbness/tingling, change to bowel or bladder function, falls or postural instability, gait changes    General Medical Review of Systems:   GEN: No weight change, fatigue, fevers, chills.  EYES: No diplopia. No photophobia  EARS: No discharge, change in hearing.  THROAT: No sore throat, dysphagia.  RESP: No dyspnea, orthopnea, cough  CV: No chest pain, no palpitations  GI: No nausea, vomiting, diarrhea, abdominal  pain, bloody stools.  GU: No hematuria, dysuria.  HEME: No history of clots, abnormal bleeding  MSK: No myalgia nor arthralgias  SKIN: No rash, no easy bruising  PSYCH: No anxiety, depression     Histories     Past Medical History:   Diagnosis Date    CAD (coronary artery disease)     Carotid arterial disease  (CMS-HCC)     Chronic pain syndrome     CKD (chronic kidney disease) stage 3, GFR 30-59 ml/min (CMS-HCC)     CVA (cerebral vascular accident) (CMS-HCC)     Depression     Dysphagia     GERD with esophagitis     History of bladder cancer     HLD (hyperlipidemia)     HTN (hypertension)     Idiopathic peripheral neuropathy     PAD (peripheral artery disease) (CMS-HCC)     Solitary pulmonary nodule present on computed tomography of lung     TMJ syndrome     Vitamin D deficiency      Past Surgical History:   Procedure Laterality Date    APPENDECTOMY      CHOLECYSTECTOMY      COLONOSCOPY  06/26/2022    CORONARY ANGIOPLASTY WITH STENT PLACEMENT  2005    LAD    ESOPHAGOGASTRODUODENOSCOPY  06/26/2022    HERNIA REPAIR  02/2022    Left inguinal    HYSTERECTOMY      REPLACEMENT TOTAL KNEE Right      Family History   Problem Relation Age of Onset    Breast Cancer Mother     Heart failure Mother     Diabetes Mother     Stroke Mother      Social History     Tobacco Use    Smoking status: Every Day     Packs/day: 1.00     Years: 20.00     Additional pack years: 0.00     Total pack years: 20.00     Types: Cigarettes     Start date: 09/12/1997    Smokeless tobacco: Never   Vaping Use    Vaping Use: Never used   Substance Use Topics    Alcohol use: Never    Drug use: Never     Social History     Tobacco Use   Smoking Status Every Day    Packs/day: 1.00    Years: 20.00    Additional pack years: 0.00    Total pack years: 20.00    Types: Cigarettes    Start date: 09/12/1997   Smokeless Tobacco Never     Social History     Substance and Sexual Activity   Drug Use Never     Social History     Substance and Sexual Activity   Alcohol Use Never     Medications and Allergies     Allergies   Allergen Reactions    Ana-Kit Bee Sting [Epinephrine-Chlorpheniramine] Anaphylaxis    Sulfa (Sulfonamide Antibiotics) Swelling    Adhesive Tape-Silicones Other (See Comments)     Skin comes off     PTA Medications:  Current Medications as of 07/12/2022  6:34  AM       Outpatient Medications         Quantity Refills Start End    albuterol (PROVENTIL) 2.5 mg /3 mL (0.083 %) nebulizer solution -- --  --    albuterol 90 mcg/actuation Inhl inhaler -- --  --    ascorbic acid, vitamin C, (VITAMIN  C) 500 MG tablet -- --  --    atorvastatin (LIPITOR) 80 MG tablet -- --  --    cyanocobalamin (VITAMIN B-12) 1000 MCG tablet -- --  --    ergocalciferol (ERGOCALCIFEROL) 1,250 mcg (50,000 unit) capsule -- --  --    fluticasone propion-salmeteroL (ADVAIR DISKUS/WIXELA INHUB) 250-50 mcg/dose -- --  --    gabapentin (NEURONTIN) 100 MG capsule -- --  --    lisinopriL (PRINIVIL) 40 MG tablet -- --  --    metOLazone (ZAROXOLYN) 5 MG tablet -- --  --    pantoprazole (PROTONIX) 40 MG tablet -- --  --    sertraline (ZOLOFT) 25 MG tablet -- --  --              Inpatient Medications         Ordered Dose Frequency Start End    acetaminophen (TYLENOL) suppository 650 mg   650 mg Every 4 hours PRN 07/12/2022 0101 (none)    See Hyperspace for full Linked Orders Report.                acetaminophen (TYLENOL) tablet 650 mg   650 mg Every 4 hours PRN 07/12/2022 0101 (none)    See Hyperspace for full Linked Orders Report.                albuterol (PROVENTIL) nebulizer solution 2.5 mg   2.5 mg RT every 4 hours PRN 07/12/2022 0338 (none)    ascorbic acid (vitamin C) (VITAMIN C) tablet 500 mg   500 mg Daily 07/12/2022 0900 (none)    aspirin chewable tablet 81 mg   81 mg Daily with breakfast 07/12/2022 0800 (none)    atorvastatin (LIPITOR) tablet 80 mg   80 mg At Bedtime (2100) 07/12/2022 0200 (none)    budesonide-formoteroL (SYMBICORT) 80-4.5 mcg/actuation inhaler 2 puff   2 puff RT 2 times daily 07/12/2022 0900 (none)    cyanocobalamin (VITAMIN B-12) tablet 1,000 mcg   1,000 mcg Daily 07/12/2022 0900 (none)    ED albuterol (PROVENTIL HFA) inhaler 2 puff   2 puff RT every 4 hours PRN 07/12/2022 0338 (none)    ergocalciferol capsule 50,000 Units   50,000 Units Weekly 07/12/2022 0900 (none)    gabapentin (NEURONTIN)  capsule 200 mg   200 mg 2 times daily 07/12/2022 0900 (none)    heparin (porcine) injection 5,000 Units   5,000 Units 3 times per day 07/12/2022 0100 (none)    lisinopriL (PRINIVIL) tablet 20 mg   20 mg Daily 07/12/2022 0900 (none)    metOLazone (ZAROXOLYN) tablet 5 mg   5 mg Daily 07/12/2022 0900 (none)    nicotine (NICODERM CQ) 14 mg/24 hr 1 patch   1 patch Daily 07/12/2022 0900 (none)    See Hyperspace for full Linked Orders Report.                nicotine (polacrilex) (NICORETTE) gum 2 mg   2 mg Every 1 hour PRN 07/12/2022 0206 (none)    See Hyperspace for full Linked Orders Report.                ondansetron (ZOFRAN) injection 4 mg   4 mg Every 6 hours PRN 07/12/2022 0101 (none)    pantoprazole (PROTONIX) EC tablet 40 mg   40 mg Every morning before breakfast 07/12/2022 0730 (none)    potassium chloride (KLOR-CON M20) CR tablet 40 mEq (Completed)   40 mEq Once 07/12/2022 0330 07/12/2022 0448    See  Hyperspace for full Linked Orders Report.                potassium chloride (KLOR-CON M20) CR tablet 40 mEq   40 mEq Once 07/12/2022 0728 07/13/2022 0730    See Hyperspace for full Linked Orders Report.                sertraline (ZOLOFT) tablet 25 mg   25 mg Daily 07/12/2022 0900 (none)    aspirin tablet 325 mg (Discontinued)   325 mg Daily 07/12/2022 0900 07/12/2022 0139    atorvastatin (LIPITOR) tablet 80 mg (Discontinued)   80 mg Daily 07/12/2022 0900 07/12/2022 0328    lisinopriL (PRINIVIL) tablet 40 mg (Discontinued)   40 mg Daily 07/12/2022 0900 07/12/2022 0311                  Physical Exam   Temp:  [98.1 F (36.7 C)-98.6 F (37 C)] 98.1 F (36.7 C)  Heart Rate:  [75-81] 80  Resp:  [14-16] 14  BP: (123-130)/(77-78) 130/77  Wt Readings from Last 3 Encounters:   07/11/22 140 lb (63.5 kg)   07/12/22 139 lb (63 kg)     Vital signs: BP, HR, and RR reviewed    General Medical Exam  CONSTITUTIONAL: Well-appearing female in no acute distress, appears stated age  EYES: Pupils round and reactive, no conjunctival injection  HENT:  normocephalic, atraumatic, hearing is grossly normal  NECK:  Neck is supple and non-rigid.  No grossly enlarged lymph nodes.  Trachea is midline.    CV: regular rate, brisk dorsalis pedis pulses bilaterally, warm and well perfused, no significant peripheral edema  RESP: No accessory muscle use, no increased work of breathing  AB: soft, no tenderness to palpation  MSK: intact ROM in neck and 4 extremities, no tenderness to palpation of  hand and feet joints  SKIN: warm, dry, intact with normal turgor, no appreciable lesions  PSYCH: Appropriate behavior, calm mood, congruent affect, no audiovisual hallucinations mentioned    Neurologic Exam  Mental Status:   Level of consciousness: alerts to voice  Orientation: oriented to self, year, month, day, date, situation.   Attention/concentration: able to attend exam well;   Language: normal fluency/word-finding in conversation  Speech: clear, coherent, goal-directed, spontaneous, without notable dysarthria  Memory: Intact to remote and recent events in conversation.   No left-right confusion, no finger agnosia,  no signs of neglect on exam    Cranial Nerves:  II, III: VFF intact to finger count, PERRL bilaterally, no APD  III, IV, VI: Gaze conjugate, EOMI with normal saccades/smooth pursuit, no nystagmus  V: Facial sensation to light touch intact in V1, V2, V3  VII: Face appears symmetric   VIII: Hearing intact to finger rub  IX/X: Symmetric palatal elevation  XI: Sternocleidomastoid strong bilaterally  XII: Tongue midline     Motor:   Normal bulk and tone    Strength:   R L   Deltoid 5 5   Biceps 5 5   Triceps 5 5   Wrist Ext 5 5   Wrist Flex 5 5   Interossei 5 5   Finger flex 5 5         Hip Flexion 5 5   Knee Flexion 4+ 5   Knee Extension 4 5     Coordination:    Right arm ataxic    Sensory:   Sensation intact to light touch on the left UE and LE  Diminished sensation on the  right UE and LE    Gait and Station:  Gait exam was not assessed     NIH STROKE SCALE  1a  Level  of consciousness: 0=alert; keenly responsive   1b. LOC questions:  0=Performs both tasks correctly   1c. LOC commands: 0=Performs both tasks correctly   2.  Best Gaze: 0=normal   3.  Visual: 0=No visual loss   4. Facial Palsy: 0=Normal symmetric movement   5a.  Motor left arm: 0=No drift, limb holds 90 (or 45) degrees for full 10 seconds   5b.  Motor right arm: 0=No drift, limb holds 90 (or 45) degrees for full 10 seconds   6a. motor left leg: 0=No drift, limb holds 90 (or 45) degrees for full 10 seconds   6b  Motor right leg:  2=Some effort against gravity, limb cannot get to or maintain (if cured) 90 (or 45) degrees, drifts down to bed, but has some effort against gravity   7. Limb Ataxia: 1=Present in one limb   8.  Sensory: 0=Normal; no sensory loss   9. Best Language:  0=No aphasia, normal   10. Dysarthria: 0=Normal   11. Extinction and Inattention: 0=No abnormality         Total:   3     Data   All diagnostic studies including radiographic, diagnostic and laboratory studies over the past 24 hours were reviewed by me and the attending.     Laboratory Results      Lab 07/12/22  0054   SODIUM 139   POTASSIUM 3.0*   CHLORIDE 98   CO2 31   BUN 15   CREATININE 0.75   GLUCOSE 111*         Lab 07/12/22  0054   AST 13   ALT 10   ALK PHOS 103   BILIRUBIN TOTAL 0.3     Lab Results   Component Value Date    WBC 5.7 07/12/2022    RBC 4.62 07/12/2022    HGB 13.6 07/12/2022    HCT 39.3 07/12/2022    MCV 85.0 07/12/2022    MCH 29.5 07/12/2022    MCHC 34.7 07/12/2022    RDW 13.9 07/12/2022    PLT 214 07/12/2022       Lab Results   Component Value Date    CHOLTOT 182 07/12/2022    TRIG 122 07/12/2022    HDL 46 (L) 07/12/2022    LDL 112 07/12/2022     Lab Results   Component Value Date    HGBA1C 5.2 07/12/2022       Imaging Studies  X-ray Comparison Images   Final Result      MRI Head WO contrast    (Results Pending)     OUTSIDE HOSPITAL RECORDS:  CT Head WO 07/11/22 with several new small low-density areas within the brainstem,  could be acute or subacute infarcts versus artifact.   CTA Neck 07/11/22 dense atheromatous calcification bilateral carotid bulbs and right proximal ICA, less than 50% reduction of origin of the right ICA, and 2.2 cm irregular area of nodular infiltrate or mass-like consolidation in the right apex of the lung.   CTA Head 07/11/22 without large vessel stenosis or occlusion.     Assessment & Plan     Michele Hopkins is a 77 y.o. 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     #Acute ischemic stroke:  Pt presented with sx of right-sided hemiparesis. LKN 07/05/22. Exam revealed ataxia of the right upper extremity, mild weakness of the right LE. Initial NIHSS of 1 per OSH. NIHSS of 3 on arrival to [facility]. OSH CT head with several new small low-density areas within the brainstem, could be acute or subacute infarcts versus artifact. Patient was outside window for TNK. Potential etiologies include cardioembolic vs artery-to-artery (esp if large-vessel intracranial atherosclerosis) vs large vessel atherosclerosis vs microvascular disease vs cryptogenic.  - Localization: brainstem  - ASA 81mg  daily, atorvastatin 80mg  qHS   - Risk stratification labs: lipid profile, A1C, TSH, UDS.   - MRI brain w/o   - Frequent neurochecks per unit routine; repeat head CT if any changes in exam  - TTE with bubble   - BP goal:   - Hold all home BP meds and allow for permissive HTN to SBP 220 (with labetalol IV PRN for SBP above)   - Can consider nicardipine gtt if BP remains uncontrolled  - If received tPA: BP goal <180/105  - Can consider resuming home BP meds in 24-48 hrs if pt stable  - Stroke rehab: bedside dysphagia screen by nursing; PT/OT/SLP  - DVT prophylaxis with SQH TID   - Maintain BG<180, SSI if needed  - Telemetry and pOx   - EEG if concern for seizure   - Long-term planning:  -  30 day cardiac event monitor as outpatient if above work up unrevealing  - If cardio-embolic: consider starting AC 2 weeks from initial event  - Follow up in stroke clinic as outpt  - Smoking cessation counseling     #COPD  #Tobacco use  #Incidental pulmonary nodule   Home regimen includes Albuterol inhaler as-needed and Advair 1 puff BID. Does not use oxygen at baseline. CT neck from OSH noted incidental 2.2 cm pulmonary nodule in the right apex of the lung. Patient will need CT chest for further evaluation of incident pulmonary nodule  -Continue home inhalers   -NRT while inpatient    #H/o CKD  Baseline Cr unknown due to lack of medical records. However, documented CKD stage 3 from OSH records. Cr 0.75 and BUN 15.  -Check daily renal panels  -Avoid nephrotoxic medications    #CAD s/p PCI to LA (2005)  #Peripheral arterial disease   Patient instructed to take daily ASA 81 mg, but frequently forgets and not listed in home medications. ECG from OSH normal sinus rhythm without ischemic changes. Troponin negative.   -Daily ASA 81 mg and Lipitor 80 mg    #Hypertension   Patient has Lisinopril 20 mg daily and 40 mg daily listed in medication list from OSH. Per patient, [facility] [place], [place]. Will obtain updated medication list from pharmacy in morning.   -Continue Lisinopril 20 mg daily     #Hyperlipidemia   -Continue Lipitor 80 mg once daily     #GERD  -Continue home pantoprazole 40 mg daily    Inpatient Management   Diet: Regular  IVF: None  Code Status: Full Code  Disposition: Floor  DVT Prophy: SQH  Bowel regimen: None  GI Ppx: None  Restraints: None  Enteral Access: PO   IV Access: PIV  Foley: None  Precautions: Fall precautions     Signed,    Armandina Stammer, DO  [facility]  [place]  6:34 AM 07/12/2022

## 2022-07-13 ENCOUNTER — Inpatient Hospital Stay: Admit: 2022-07-13 | Payer: Medicare (Managed Care)

## 2022-07-13 MED ORDER — guaiFENesin (MUCINEX) 12 hr tablet 600 mg
600 | Freq: Two times a day (BID) | ORAL | Status: AC | PRN
Start: 2022-07-13 — End: 2022-07-22
  Administered 2022-07-13 – 2022-07-19 (×2): 600 mg via ORAL

## 2022-07-13 MED ORDER — perflutren (OPTISON) Susp 0.66 mg
0.22 | Freq: Once | INTRAVENOUS | Status: AC | PRN
Start: 2022-07-13 — End: 2022-07-13
  Administered 2022-07-13: 13:00:00 0.66 mg via INTRAVENOUS

## 2022-07-13 MED FILL — VITAMIN C 500 MG TABLET: 500 500 MG | ORAL | Qty: 1

## 2022-07-13 MED FILL — HEPARIN (PORCINE) 5,000 UNIT/ML INJECTION SOLUTION: 5000 5,000 unit/mL | INTRAMUSCULAR | Qty: 1

## 2022-07-13 MED FILL — ASPIRIN 81 MG CHEWABLE TABLET: 81 81 MG | ORAL | Qty: 1

## 2022-07-13 MED FILL — SERTRALINE 50 MG TABLET: 50 50 MG | ORAL | Qty: 1

## 2022-07-13 MED FILL — TYLENOL 325 MG TABLET: 325 325 mg | ORAL | Qty: 2

## 2022-07-13 MED FILL — OPTISON 0.22 MG/ML INTRAVENOUS SUSPENSION: 0.22 0.22 mg/mL | INTRAVENOUS | Qty: 3

## 2022-07-13 MED FILL — LISINOPRIL 10 MG TABLET: 10 10 MG | ORAL | Qty: 2

## 2022-07-13 MED FILL — ATORVASTATIN 80 MG TABLET: 80 80 MG | ORAL | Qty: 1

## 2022-07-13 MED FILL — MUCINEX 600 MG TABLET, EXTENDED RELEASE: 600 600 mg | ORAL | Qty: 1

## 2022-07-13 MED FILL — METOLAZONE 5 MG TABLET: 5 5 MG | ORAL | Qty: 1

## 2022-07-13 MED FILL — VITAMIN B-12  1,000 MCG TABLET: 1000 1000 MCG | ORAL | Qty: 1

## 2022-07-13 MED FILL — GABAPENTIN 100 MG CAPSULE: 100 100 MG | ORAL | Qty: 2

## 2022-07-13 MED FILL — NICOTINE 14 MG/24 HR DAILY TRANSDERMAL PATCH: 14 14 mg/24 hr | TRANSDERMAL | Qty: 1

## 2022-07-13 MED FILL — PANTOPRAZOLE 40 MG TABLET,DELAYED RELEASE: 40 40 MG | ORAL | Qty: 1

## 2022-07-13 NOTE — Nursing Note (Signed)
Stroke Nurse Navigator note:    Stroke Nurse Navigators following for potential stroke. Patient is currently pending MRI of brain.     Stroke education along with risk factor education added to discharge paperwork. FAST education and when to call 911 included on discharge paperwork.  Neurology follow up appointment will be scheduled pending team recommendations.

## 2022-07-13 NOTE — Consults (Signed)
Speech Language Pathology  Speech, Language and Cognitive Initial Assessment & Discharge    Name: Michele Hopkins  DOB: [birth year]  Attending Physician: Marchelle Folks, MD  Admission Diagnosis: possible stroke  Date: 07/13/2022  Precautions: None  Reviewed Pertinent hospital course: Yes  Hospital Course SLP: 77 y.o. 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 OSH CT head: several new small low-density areas in brainstem, acute/subacute infarcts vs artifact. MRI Head pending.  SLP Hx: None    Assessment: Pt demonstrates functional speech, language and cognitive-linguistic skills. The patient was administered the Cognistat which assesses orientation, attention, language, constructional ability, memory, reasoning, and judgement with a composite score of 75 / 76 with the breakdown below.    NCSE Component Score Total Severity of Deficits   Orientation 12 12 WFL   Attention 8 8 WFL   Comprehension 6 6 WFL   Repetition 12 12 WFL   Naming 8 8 WFL   Constructional Ability X X DNT   Memory 11 12 WFL   Calculations 4 4 WFL   Similarities 8 8 WFL   Judgement 6 6 WFL   Composite 75 76      Plan/Recommendation:   - Discharge from SLP- no acute SLP needs at this time  - SLP at discharge is not recommended    Prognosis  Diagnosis: WFL  Prognosis: Excellent  Potential: Excellent    Problem List  Patient Active Problem List   Diagnosis    Stroke (CMS-HCC)    COPD (chronic obstructive pulmonary disease) (CMS-HCC)    HTN (hypertension)    HLD (hyperlipidemia)    CKD (chronic kidney disease) stage 3, GFR 30-59 ml/min (CMS-HCC)        Past Medical History  Past Medical History:   Diagnosis Date    CAD (coronary artery disease)     Carotid arterial disease (CMS-HCC)     Chronic pain syndrome     CKD (chronic kidney disease) stage 3, GFR 30-59 ml/min (CMS-HCC)      CVA (cerebral vascular accident) (CMS-HCC)     Depression     Dysphagia     GERD with esophagitis     History of bladder cancer     HLD (hyperlipidemia)     HTN (hypertension)     Idiopathic peripheral neuropathy     PAD (peripheral artery disease) (CMS-HCC)     Solitary pulmonary nodule present on computed tomography of lung     TMJ syndrome     Vitamin D deficiency         Past Surgical History  Past Surgical History:   Procedure Laterality Date    APPENDECTOMY      CHOLECYSTECTOMY      COLONOSCOPY  06/26/2022    CORONARY ANGIOPLASTY WITH STENT PLACEMENT  2005    LAD    ESOPHAGOGASTRODUODENOSCOPY  06/26/2022    HERNIA REPAIR  02/2022    Left inguinal    HYSTERECTOMY      REPLACEMENT TOTAL KNEE Right        Orientation:  Person: Yes  Place: Yes  Time: Yes  Situation: Yes  Behavioral Profile: alert;cooperative;pleasant    Pain  Generalized. RN monitoring     Respiratory Status  Respiratory Status: O2 via nasual cannula    Receptive Language  Simple Yes/No Questions: [facility]  Complex  Yes/No Questions: WFL  One Step Basic Commands: Within Functional Limits  Multistep Basic Commands: Within Functional Limits  Object Identification: Within Functional Limits  Picture Identification: Within Functional Limits    Expressive Language  Primary Mode of Expression: Verbal  Primary Language: English  Confrontation Naming: Within Functional Limits  Responsive Naming: Within Functional Limits  Sentence Completion: Within Functional Limits  Spontaneous Speech in Conversation: WFL    Cognitive  Attention: Within Functional Limits  Memory: Within Functional Limits  Problem Solving: Within Functional Limits  Numeric Reasoning: Within Functional Limits  Abstract Reasoning: Within Functional Limits     Motor Speech  Intelligibility: Intelligible    Patient Education  Patient educated on: role of SLP, current POC, and discharge recommendations for SLP therapy  Patient response: Patient verbalized understanding    End of Session:  Patient was  left upright in chair with call light within reach and all needs met.  Safety handoff completed with RN.        Arman Bogus, MS, CCC-SLP  Speech Language Pathologist  Registered MBSImP Clinician  [facility] Certified  Rehabilitation Services    Time  Start Time: [phone]  Stop Time: [id number]  Time Calculation (min): 19 min  Charges   $Eval Speech Sound Prd w/Lng Comp & Expr: 1 Procedure  Patient Class   Inpatient

## 2022-07-13 NOTE — Plan of Care (Signed)
Problem: Safety  Goal: Patient will be injury free during hospitalization  Description: Assess and monitor vitals signs, neurological status including  level of consciousness and orientation. Assess patient's risk for falls and  implement fall prevention plan of care and interventions per hospital policy.    Ensure arm band on, uncluttered walking paths in room, adequate room lighting,  call light and overbed table within reach, bed in low position, wheels locked,  side rails up per policy, and non-skid footwear provided.  Outcome: Progressing    Problem: Patient will remain free of falls  Goal: Universal Fall Precautions  Outcome: Progressing    Problem: Daily Care  Goal: Daily care needs are met  Description: Assess and monitor ability to perform self care and identify  potential discharge needs.  Outcome: Progressing

## 2022-07-13 NOTE — Progress Notes (Signed)
[facility]  Department of Neurology and Rehab Medicine  Inpatient Progress Note    Chief Complaint / Brief Hospital Course     Michele Hopkins is a 77 y.o. 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 Had a fall from the side of the bed on 4/8 without LOC; she reported feeling unsteady. After the fall, experienced R sided weakness involving hand, arm, face. Symptoms began to improve so she did not seek medical evaluation. On 4/13, she sat down to drink coffee and was unable to stand up nor move the R side of her body, trouble speaking and swallowing. First evaluated at OSH with [facility] revealing several new small low-density areas within the brainstem. CTA neck dense atheromatous calcification bilateral carotid bulbs and right proximal ICA, less than 50% reduction of origin of the right ICA     Today is Hospital Day 2.    Interval History / Subjective     This morning she reports improved sensation over her R lower face. No other new symptoms or difficulties  No events overnight, VSS    Review of Systems (focused)     Constitutional: Negative for fever.   Respiratory: Negative for cough and shortness of breath.  Cardiovascular: Negative for chest pain, palpitations  Gastrointestinal: Negative for abdominal pain, N/V, diarrhea and constipation.  Neurological: Negative for new numbness, tingling, or focal weakness    Medications   I personally reviewed all scheduled and PRN medications. All allergies were reviewed and updated.  See MAR for additional details.    Scheduled Medications:   ascorbic acid (vitamin C)  500 mg Oral Daily 0900    aspirin  81 mg Oral Daily with breakfast    atorvastatin  80 mg Oral Nightly (2100)    budesonide-formoteroL  2 puff Inhalation RT BID    cyanocobalamin  1,000 mcg Oral Daily 0900     ergocalciferol  50,000 Units Oral Weekly    gabapentin  200 mg Oral BID    heparin  5,000 Units Subcutaneous 3 times per day    lisinopriL  20 mg Oral Daily 0900    metOLazone  5 mg Oral Daily 0900    nicotine  1 patch Transdermal Daily 0900    pantoprazole  40 mg Oral QAM AC    sertraline  25 mg Oral Daily 0900       Continuous Medications:      Current Facility-Administered Medications   Medication Dose Frequency Provider Last Admin    acetaminophen  650 mg Q4H PRN Chanetta Marshall, MD 650 mg at 07/12/22 2046    Or    acetaminophen  650 mg Q4H PRN Chanetta Marshall, MD      albuterol  2.5 mg RT Q4H PRN Chanetta Marshall, MD      ascorbic acid (vitamin C)  500 mg Daily 0900 Chanetta Marshall, MD 500 mg at 07/13/22 5409    aspirin  81 mg Daily with breakfast Chanetta Marshall, MD 81 mg at 07/13/22 8119    atorvastatin  80 mg Nightly (2100) Chanetta Marshall, MD 80 mg at 07/12/22 2046    budesonide-formoteroL  2 puff RT BID Chanetta Marshall, MD 2 puff at 07/12/22 2201    cyanocobalamin  1,000 mcg Daily 0900 Chanetta Marshall, MD 1,000 mcg at 07/13/22 [phone]  ED albuterol  2 puff RT Q4H PRN Chanetta Marshall, MD      ergocalciferol  50,000 Units Weekly Chanetta Marshall, MD 50,000 Units at 07/12/22 1119    gabapentin  200 mg BID Chanetta Marshall, MD 200 mg at 07/13/22 0827    guaiFENesin  600 mg BID PRN Ruthe Mannan, MD      heparin  5,000 Units 3 times per day Chanetta Marshall, MD 5,000 Units at 07/13/22 1249    lisinopriL  20 mg Daily 0900 Chanetta Marshall, MD 20 mg at 07/13/22 0827    metOLazone  5 mg Daily 0900 Chanetta Marshall, MD 5 mg at 07/13/22 1610    nicotine  1 patch Daily 0900 Chanetta Marshall, MD 1 patch at 07/13/22 9604    And    nicotine (polacrilex)  2 mg Q1H PRN Chanetta Marshall, MD 2 mg at 07/13/22 0834    ondansetron  4 mg Q6H PRN Chanetta Marshall, MD      pantoprazole  40 mg QAM AC Chanetta Marshall, MD 40 mg at 07/13/22 5409    sertraline  25 mg Daily 0900 Chanetta Marshall, MD 25 mg at 07/13/22 8119       Vital Signs  and Intake/Output   All vital signs during the previous 24 hours reviewed.     Temp:  [97.9 F (36.6 C)-98.8 F (37.1 C)] 98 F (36.7 C)  Heart Rate:  [71-130] 77  Resp:  [14-17] 16  BP: (119-141)/(77-91) 140/77      Intake/Output Summary (Last 24 hours) at 07/13/2022 1408  Last data filed at 07/13/2022 0400  Gross per 24 hour   Intake --   Output 600 ml   Net -600 ml       Physical Exam     General Medical Exam  CONSTITUTIONAL: well-appearing adult women in no acute distress, appears stated age  EYES: no conjunctival injection or scleral icterus  CV: warm and well perfused, no significant peripheral edema  RESP: no accessory muscle use, no increased WOB on room air  SKIN: warm, dry, intact with normal turgor, no appreciable lesions    Neurologic Exam  Mental Status:   Level of consciousness: alerts spontaneously and to voice  Orientation: oriented to person, place, day, situation  Attention/concentration: able to attend exam well  Language: normal fluency/word-finding in conversation, no notable paraphasic errors, able to follow multi-step commands.  Speech: clear, coherent, goal-directed, spontaneous, without notable dysarthria  No left-right confusion. No signs of neglect on exam    Cranial Nerves:  CN II: Visual fields grossly intact, tracks across visual fields.  CN III/IV/VI: Gaze conjugate, EOMI with normal saccades/smooth pursuit, no nystagmus  CN V: Facial sensation to light touch intact in V1, V2; subjectively diminished on R relative to L in V3  CN VII: Facial motion and strength intact and symmetric   CN VIII: Hearing grossly intact to conversation  CN IX/X: Strong phonation, cough  CN XI: Sternocleidomastoid strong bilaterally  CN XII: Tongue protrudes midline    Motor:   Decreased bulk appropriate to age.  No rest tremor or adventitial movements observed.    No pronator drift.     Strength: Raises and holds antigravity for all four extremities, but difficulty raising RLE to 45 degrees and drifts  down    Coordination:  No dysmetria on finger-to-nose. Able to perform finger tapping.    Reflexes: Not tested today    Sensory: Reports diminished sensation on RUE/RLE relative to the L    Gait  and Station:  Station: normal truncal control, rises from bed without assist.  Gait: normal gait and base with standard walk.      Diagnostic Studies   All diagnostic studies over the past 24 hours were reviewed by me and the attending. Pertinent findings discussed below in the A&P.    Labs:        Lab 07/12/22  0054   SODIUM 139   POTASSIUM 3.0*   CHLORIDE 98   CO2 31   BUN 15   CREATININE 0.75   GLUCOSE 111*         Lab 07/12/22  0054   WBC 5.7   HEMOGLOBIN 13.6   HEMATOCRIT 39.3   MEAN CORPUSCULAR VOLUME 85.0   PLATELETS 214       Radiology:    [facility] and CTA performed at OSH      Assessment and Plan     Adessa Primiano is a 77 y.o. 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 Today is Hospital Day 2.     I note the following medical issues:    #Acute Ischemic Stroke  Presented with R-sided hemiparesis and sensory loss. Reportedly had an NIHSS1 on arrival to OSH; [place] on arrival to [facility]. OSH CTH notable for several new, small, low-density areas within the pons / brainstem that could be acute or subacute infarcts vs artifact. Patient was outside window for TNK (LKW was 4/7). Potential etiologies include cardioembolic vs thromboembolic vs large vessel atherosclerosis vs chronic microvascular disease vs cryptogenic. Risk stratification labs: A1c 5.2%, LDL 112. At this time, etiology of stroke is cryptogenic pending further diagnostic studies (MRI, TTE)  - Continue CMU tele  - Neurochecks Q4h  - MRI Head W/O Contrast  - TTE W/ Bubble  - Secondary stroke prevention: ASA 81 mg daily, Atorvastatin 80 mg QHS  - Will determine need for event monitor  - PT/OT/SLP  - Stroke Nurse consult    #CAD s/p PCI  to LAD  #HTN  Patient instructed to take daily ASA 81 mg, but frequently forgets and not listed in home medications. ECG from OSH normal sinus rhythm without ischemic changes. Troponin in normal range.   - Continue ASA and Atorvastatin  - Continue home Lisinopril 20 mg daily, Metolazone 5 mg daily    #COPD  #Tobacco Use Disorder  #Incidental pulmonary nodule  Home regimen includes Albuterol inhaler as-needed and Advair 1 puff BID. Does not use oxygen at baseline. CT neck from OSH noted incidental 2.2 cm pulmonary nodule in the right apex of the lung. Patient will need CT chest for further evaluation of incident pulmonary nodule.  - Continue home inhalers  - Monitor WOB  - Nicotine gum PRN  - Consider smoking cessation counseling  - Will need CT Chest outpatient    #HLD  Lipid Profile: Triglyc 122, HDL 46, LDL 112, T-Chol 182,   - Continue home Atorvastatin    #CKD Stage III  Baseline Cr unknown due to lack of medical records. However, documented CKD stage 3 from OSH records. Cr 0.75, BUN 15 on admission.   - Avoid nephrotoxic medications    #GERD  - Continue home Pantoprazole    #Neuropsych  - Continue home Sertraline, Gabapentin      Inpatient Checklist:    Diet:  Diet/Nutrition Orders    Diet Regular(7)  Frequency: Effective Now     Number of Occurrences: Until Specified     Order Questions:      Suicide/Behavior Risk Modification? No     IV Fluids: None  Lines: PIV  Foley: No  Telemetry: Yes - stroke  Pain management: Acetaminophen PRN  DVT prophylaxis: SQH  GI prophylaxis: PPI  Bowel Regimen: Suppository PRN  Precautions/Isolation: None  Ambulation: as tolerated  Ancillary services: PT/OT/SLP/SW  Code Status: Full Code  Dispo: Floor         PT Intervention Plan: Treatment/Interventions: LE strengthening/ROM, Therapeutic Activity, Therapeutic Exercise, Endurance training, Patient/family training, Equipment eval/education, Gait training, Press photographer, Stair Training  PT Frequency: minimum 3x/week        PT recs: Recommendation: IP Rehab  Equipment Recommended: Defer to facility to obtain, Rolling walker       OT Interventions Plan: Treatment Interventions: Activity Tolerance training, ADL retraining, IADL retraining, Energy Conservation, Equipment eval/education, Cognitive reorientation, Neuro muscular reeducation, Functional transfer training, Fine motor coordination activities, Patient/Family training, UE strengthening/ROM, Therapeutic Activity, Compensatory technique education, Excercise  OT Frequency: minimum 3x/week       OT recs: Recommendation: IP Rehab  Equipment Recommendations: Defer at this time       SLP recs: Diagnosis: WFL      Signed:    Karie Mainland, MD, PhD  [facility] Pediatric Neurology Resident, PGY-3  07/13/2022

## 2022-07-13 NOTE — Plan of Care (Signed)
Problem: Safety  Goal: Patient will be injury free during hospitalization  Description: Assess and monitor vitals signs, neurological status including level of consciousness and orientation. Assess patient's risk for falls and implement fall prevention plan of care and interventions per hospital policy.      Ensure arm band on, uncluttered walking paths in room, adequate room lighting, call light and overbed table within reach, bed in low position, wheels locked, side rails up per policy, and non-skid footwear provided.   Outcome: Progressing     Problem: Psychosocial Needs  Goal: Demonstrates ability to cope with hospitalization/illness  Description: Assess and monitor patients ability to cope with his/her illness.  Outcome: Progressing

## 2022-07-13 NOTE — Consults (Signed)
Stroke Nurse Navigators have been consulted.  Will follow closely in coordination with primary team.  Please call Stroke Nurse Navigators with any needs:  [phone].

## 2022-07-13 NOTE — Consults (Signed)
Speech Language Pathology  Clinical Swallow Assessment & Discharge    Name: Michele Hopkins  DOB: 1945/06/09  Attending Physician: Marchelle Folks, MD  Admission Diagnosis: possible stroke  Date: 07/13/2022  Precautions: None  Reviewed Pertinent hospital course: Yes  Hospital Course SLP: 77 y.o. female with remote history of CVA (1980's), CAD s/p PCI to LAD (2005), PAD, COPD, tobacco use, HLD, HTN, and CKD stage 3 who was transferred from [facility] due to concerns for stroke, right sided weakness. OSH CT head: several new small low-density areas in brainstem, acute/subacute infarcts vs artifact. MRI Head pending.  SLP Hx: None    Assessment: Patient presents with no clinical signs or symptoms of aspiration with any consistency, or concerns for oropharyngeal dysphagia. Successive 3oz thin liquid challenge was completed without overt difficulty. Pt has been tolerating regular diet without any overt issues and denies issues with swallowing. A Regular (IDDSI 7) with Thin Liquids (IDDSI 0) is recommended and no further acute SLP dysphagia intervention is warranted at this time.    Plan/Recommendation:   - Diet: Regular (IDDSI 7) with Thin Liquids (IDDSI 0)  - Discharge from SLP- no acute SLP needs at this time  - SLP at discharge is not recommended    Orientation:  Person: Yes  Place: Yes  Time: Yes  Situation: Yes  Behavioral Profile: alert, cooperative, pleasant    Pain:  Generalized, RN monitoring.     Prognosis: Excellent  Potential: Excellent    Baseline Assessment  History of Intubation: No  Dentition: Dentures top (edentulous bottom; has bottom dentures at home)  Baseline diet: Regular  Current diet: Regular  Vision: Functional for self-feeding  Patient Positioning: Upright in chair    Respiratory Status  Respiratory Status: O2 via nasual cannula    Cranial Nerve & Laryngeal Function Exam  CNV- Trigeminal: Within Functional Limits  CNVII - Facial : Within Functional Limits (reports slight decreased  sensation on right side butimproving; WFL on tasks)  CNIX - Glossopharyngeal: Within Functional Limits  CNX - Vagus: Within Functional Limits  CNVXII - Hypoglossal Status: Within Functional Limits    Dentition and Hearing  Dentition: Dentures top (edentulous bottom; has bottom dentures at home)  Hearing Exceptions: None    Consistencies Assessed  Consistencies Assessed: Thin;Hard Solid (RN administered meds during session)    Patient and Family Education  Patient and Family Education: Patient educated on, role of SLP, swallowing anatomy & physiology  Education response: Patient demonstrated understanding    End of Session:  Patient was left in bed with call light within reach and all needs met.  Safety handoff completed with RN.        Arman Bogus, MS, CCC-SLP  Speech Language Pathologist  Registered MBSImP Clinician  [facility] Certified  Rehabilitation Services    Time  Start Time: [phone]  Stop Time: [phone]  Time Calculation (min): 19 min  Charges   $Clinical Swallow: 1 Procedure  Patient Class   Inpatient

## 2022-07-13 NOTE — Discharge Instructions (Signed)
You were treated at the [facility] for symptoms concerning for a stroke.   We are committed to providing you with information about your diagnosis and ways to help keep the blood vessels in your brain as healthy as possible.    It is important to understand your specific risk factors for stroke, which include a prior stroke, high blood pressure, high cholesterol and smoking.  It is also crucial that you take your stroke specific medications as directed in your discharge instructions.  If you are experiencing any stroke-like symptoms below, call 911 immediately:  Balance problems, Eyes (vision loss or blurry vision), Facial drooping, Arm weakness, Slurred speech. Time to call 911 (B.[id number].F.A.S.T.).  Please schedule an appointment with your Primary Care Physician (PCP) within 2 weeks of discharge to discuss the ways to best manage your risk factors. If you do not have a PCP, call [phone] to establish with [facility].  If you have any questions or concerns, the Stroke Nurse Navigators are available to help.  Please call the stroke nurse hotline at [phone] with any non-urgent concerns.  You can also call the [facility] at [phone] to contact your physician. The Stroke Nurse Navigators will call you with Neurology follow up appointment information if recommended by your physician.

## 2022-07-14 ENCOUNTER — Inpatient Hospital Stay: Admit: 2022-07-14 | Payer: Medicare (Managed Care)

## 2022-07-14 MED FILL — VITAMIN C 500 MG TABLET: 500 500 MG | ORAL | Qty: 1

## 2022-07-14 MED FILL — TYLENOL 325 MG TABLET: 325 325 mg | ORAL | Qty: 2

## 2022-07-14 MED FILL — GABAPENTIN 100 MG CAPSULE: 100 100 MG | ORAL | Qty: 2

## 2022-07-14 MED FILL — SERTRALINE 50 MG TABLET: 50 50 MG | ORAL | Qty: 1

## 2022-07-14 MED FILL — HEPARIN (PORCINE) 5,000 UNIT/ML INJECTION SOLUTION: 5000 5,000 unit/mL | INTRAMUSCULAR | Qty: 1

## 2022-07-14 MED FILL — PANTOPRAZOLE 40 MG TABLET,DELAYED RELEASE: 40 40 MG | ORAL | Qty: 1

## 2022-07-14 MED FILL — NICOTINE 14 MG/24 HR DAILY TRANSDERMAL PATCH: 14 14 mg/24 hr | TRANSDERMAL | Qty: 1

## 2022-07-14 MED FILL — LISINOPRIL 10 MG TABLET: 10 10 MG | ORAL | Qty: 2

## 2022-07-14 MED FILL — ATORVASTATIN 80 MG TABLET: 80 80 MG | ORAL | Qty: 1

## 2022-07-14 MED FILL — VITAMIN B-12  1,000 MCG TABLET: 1000 1000 MCG | ORAL | Qty: 1

## 2022-07-14 MED FILL — METOLAZONE 5 MG TABLET: 5 5 MG | ORAL | Qty: 1
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
## 2022-07-14 NOTE — Plan of Care (Signed)
Problem: Safety  Goal: Patient will be injury free during hospitalization  Description: Assess and monitor vitals signs, neurological status including level of consciousness and orientation. Assess patient's risk for falls and implement fall prevention plan of care and interventions per hospital policy.      Ensure arm band on, uncluttered walking paths in room, adequate room lighting, call light and overbed table within reach, bed in low position, wheels locked, side rails up per policy, and non-skid footwear provided.   Outcome: Progressing  Goal: Patient with weight > 350lbs will have appropriate equipment  Description: Consider ordering Bariatric Bed, Chair and Bedside Commode for patient weight > 350 lbs.  Outcome: Progressing     Problem: Patient will remain free of falls  Goal: Universal Fall Precautions  Outcome: Progressing     Problem: Daily Care  Goal: Daily care needs are met  Description: Assess and monitor ability to perform self care and identify potential discharge needs.  Outcome: Progressing     Problem: Psychosocial Needs  Goal: Demonstrates ability to cope with hospitalization/illness  Description: Assess and monitor patients ability to cope with his/her illness.  Outcome: Progressing     Problem: Discharge Barriers  Goal: Patient's discharge needs are met  Description: Collaborate with interdisciplinary team and initiate plans and interventions as needed.   Outcome: Progressing     Problem: Patient will remain free of injury d/t fall  Goal: High Fall Risk Precautions  Outcome: Progressing  Goal: Additional High Fall Risk Precautions (consider the following)  Outcome: Progressing     Problem: Knowledge Deficit  Goal: Patient/family/caregiver demonstrates understanding of disease process, treatment plan, medications, and discharge instructions  Description: Complete learning assessment and assess knowledge base.  Outcome: Progressing     Problem: Potential for Compromised Skin Integrity  Goal:  Skin integrity is maintained or improved  Description: Assess and monitor skin integrity. Identify patients at risk for skin breakdown on admission and per policy. Collaborate with interdisciplinary team and initiate plans and interventions as needed.  Outcome: Progressing  Goal: Nutritional status is improving  Description: Monitor and assess patient for malnutrition (ex- brittle hair, bruises, dry skin, pale skin and conjunctiva, muscle wasting, smooth red tongue, and disorientation). Collaborate with interdisciplinary team and initiate plan and interventions as ordered.  Monitor patient's weight and dietary intake as ordered or per policy. Utilize nutrition screening tool and intervene per policy. Determine patient's food preferences and provide high-protein, high-caloric foods as appropriate.   Outcome: Progressing     Problem: Incontinence  Goal: Perineal skin integrity is maintained or improved  Description: Assess genitourinary system, perineal skin, labs (urinalysis), and history of incontinence to include past management, aggravating, and alleviating factors.  Collaborate with interdisciplinary team and initiate plans and interventions as needed.  Outcome: Progressing     Problem: Acute Pain  Description: Patient's pain progressing toward patient's stated pain goal  Goal: Patient displays improved well-being such as baseline levels for pulse, BP, respirations and relaxed muscle tone or body posture  Outcome: Progressing  Goal: Patient will manage pain with the appropriate technique/intervention  Description: Assess and monitor patient's pain using appropriate pain scale. Collaborate with interdisciplinary team and initiate plan and interventions as ordered.  Re-assess patient's pain level 30-60 minutes after pain management intervention.  Outcome: Progressing  Goal: Patient will reduce or eliminate use of analgesics  Outcome: Progressing  Goal: Patients pain is managed to allow active participation in daily  activities  Outcome: Progressing  Goal: Patient verbalizes a reduction in  pain level  Outcome: Progressing  Goal: Discharge Pain Management Plan (Acute Pain)  Outcome: Progressing     Problem: Chronic Pain  Description: Patient's pain progressing toward patient's stated pain goal  Goal: Pt will have limited adverse effects related to chronic pain  Description: i.e. Depression, opioid induced constipation, respiratory depression  Outcome: Progressing  Goal: Patient will manage pain with the appropriate technique/intervention  Description: Assess and monitor patient's pain using appropriate pain scale. Collaborate with interdisciplinary team and initiate plan and interventions as ordered.  Re-assess patient's pain level 30-60 minutes after pain management intervention.  Outcome: Progressing  Goal: Patient will reduce or eliminate use of analgesics  Outcome: Progressing  Goal: Patients pain is managed to allow active participation in daily activities  Outcome: Progressing  Goal: Patient verbalizes a reduction in pain level  Outcome: Progressing  Goal: Discharge Pain Management Plan (Chronic Pain)  Outcome: Progressing

## 2022-07-14 NOTE — Progress Notes (Signed)
[facility]  Department of Neurology and Rehab Medicine  Inpatient Progress Note    Chief Complaint / Brief Hospital Course     Michele Hopkins is a 77 y.o. 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 Had a fall from the side of the bed on 4/8 without LOC; she reported feeling unsteady. After the fall, experienced R sided weakness involving hand, arm, face. Symptoms began to improve so she did not seek medical evaluation. On 4/13, she sat down to drink coffee and was unable to stand up nor move the R side of her body, trouble speaking and swallowing. First evaluated at OSH with [facility] revealing several new small low-density areas within the brainstem. CTA neck dense atheromatous calcification bilateral carotid bulbs and right proximal ICA (less than 50% reduction of origin of the right ICA).     Today is Hospital Day 3.    Interval History / Subjective     No acute events overnight, no new neurologic concerns. SBP 90s-110s. Had MRI yesterday that was significant for small acute infarct in the L basal ganglia    Review of Systems (focused)     Constitutional: Negative for fever.   Respiratory: Negative for cough and shortness of breath.  Cardiovascular: Negative for chest pain, palpitations  Gastrointestinal: Negative for abdominal pain, N/V, diarrhea and constipation.  Neurological: Negative for new numbness, tingling, or focal weakness    Medications   I personally reviewed all scheduled and PRN medications. All allergies were reviewed and updated.  See MAR for additional details.    Scheduled Medications:   ascorbic acid (vitamin C)  500 mg Oral Daily 0900    aspirin  81 mg Oral Daily with breakfast    atorvastatin  80 mg Oral Nightly (2100)    budesonide-formoteroL  2 puff Inhalation RT BID    cyanocobalamin  1,000 mcg  Oral Daily 0900    ergocalciferol  50,000 Units Oral Weekly    gabapentin  200 mg Oral BID    heparin  5,000 Units Subcutaneous 3 times per day    lisinopriL  20 mg Oral Daily 0900    metOLazone  5 mg Oral Daily 0900    nicotine  1 patch Transdermal Daily 0900    pantoprazole  40 mg Oral QAM AC    sertraline  25 mg Oral Daily 0900       Continuous Medications:      Current Facility-Administered Medications   Medication Dose Frequency Provider Last Admin    acetaminophen  650 mg Q4H PRN Chanetta Marshall, MD 650 mg at 07/14/22 0542    Or    acetaminophen  650 mg Q4H PRN Chanetta Marshall, MD      albuterol  2.5 mg RT Q4H PRN Chanetta Marshall, MD      ascorbic acid (vitamin C)  500 mg Daily 0900 Chanetta Marshall, MD 500 mg at 07/13/22 2440    aspirin  81 mg Daily with breakfast Chanetta Marshall, MD 81 mg at 07/13/22 1027    atorvastatin  80 mg Nightly (2100) Chanetta Marshall, MD 80 mg at 07/13/22 2148    budesonide-formoteroL  2 puff RT BID Chanetta Marshall, MD 2 puff at 07/12/22 2201    cyanocobalamin  1,000 mcg Daily 0900 Chanetta Marshall, MD 1,000 mcg at 07/13/22 [phone]  ED albuterol  2 puff RT Q4H PRN Chanetta Marshall, MD      ergocalciferol  50,000 Units Weekly Chanetta Marshall, MD 50,000 Units at 07/12/22 1119    gabapentin  200 mg BID Chanetta Marshall, MD 200 mg at 07/13/22 2149    guaiFENesin  600 mg BID PRN Ruthe Mannan, MD 600 mg at 07/13/22 1606    heparin  5,000 Units 3 times per day Chanetta Marshall, MD 5,000 Units at 07/14/22 0542    lisinopriL  20 mg Daily 0900 Chanetta Marshall, MD 20 mg at 07/13/22 0827    metOLazone  5 mg Daily 0900 Chanetta Marshall, MD 5 mg at 07/13/22 1610    nicotine  1 patch Daily 0900 Chanetta Marshall, MD 1 patch at 07/13/22 9604    And    nicotine (polacrilex)  2 mg Q1H PRN Chanetta Marshall, MD 2 mg at 07/13/22 0834    ondansetron  4 mg Q6H PRN Chanetta Marshall, MD      pantoprazole  40 mg QAM AC Chanetta Marshall, MD 40 mg at 07/14/22 0542    sertraline  25 mg Daily 0900 Chanetta Marshall, MD 25  mg at 07/13/22 5409       Vital Signs and Intake/Output   All vital signs during the previous 24 hours reviewed.     Temp:  [98.2 F (36.8 C)-99.3 F (37.4 C)] 98.2 F (36.8 C)  Heart Rate:  [66-86] 68  Resp:  [16-17] 16  BP: (91-140)/(57-77) 116/68    No intake or output data in the 24 hours ending 07/14/22 0756      Physical Exam     General Medical Exam  CONSTITUTIONAL: well-appearing adult women in no acute distress, appears stated age  EYES: no conjunctival injection or scleral icterus  CV: warm and well perfused, no significant peripheral edema  RESP: no accessory muscle use, no increased WOB on room air  SKIN: warm, dry, intact with normal turgor, no appreciable lesions    Neurologic Exam  Mental Status:   Level of consciousness: alerts spontaneously and to voice  Orientation: oriented to person, place, day, situation  Attention/concentration: able to attend exam well  Language: normal fluency/word-finding in conversation, no notable paraphasic errors, able to follow multi-step commands.  Speech: clear, coherent, goal-directed, spontaneous, without notable dysarthria  No left-right confusion. No signs of neglect on exam    Cranial Nerves:  CN II: Visual fields grossly intact, tracks across visual fields.  CN III/IV/VI: Gaze conjugate, EOMI with normal saccades/smooth pursuit, no nystagmus  CN V: Facial sensation to light touch intact in V1, V2; subjectively diminished on R relative to L in V3  CN VII: Facial motion and strength intact and symmetric   CN VIII: Hearing grossly intact to conversation  CN IX/X: Strong phonation, cough  CN XI: Sternocleidomastoid strong bilaterally  CN XII: Tongue protrudes midline    Motor:   Decreased bulk appropriate to age.  No rest tremor or adventitial movements observed.    No pronator drift.     Strength: Raises and holds antigravity for all four extremities, but difficulty raising RLE to 45 degrees and drifts down    Coordination:  No dysmetria on finger-to-nose. Able  to perform finger tapping.    Reflexes: Not tested today    Sensory: Reports diminished sensation on RUE/RLE relative to the L    Gait and Station:  Station: normal truncal control, rises from bed without assist.  Gait: normal gait and base with standard walk.  Diagnostic Studies   All diagnostic studies over the past 24 hours were reviewed by me and the attending. Pertinent findings discussed below in the A&P.    Labs:        Lab 07/12/22  0054   SODIUM 139   POTASSIUM 3.0*   CHLORIDE 98   CO2 31   BUN 15   CREATININE 0.75   GLUCOSE 111*         Lab 07/12/22  0054   WBC 5.7   HEMOGLOBIN 13.6   HEMATOCRIT 39.3   MEAN CORPUSCULAR VOLUME 85.0   PLATELETS 214       Radiology:    [facility] and CTA performed at OSH    MRI Brain (07/13/2022)    IMPRESSION:   1.  2 punctate foci of acute ischemia in the left basal ganglia measuring less than 1 mm each.       Assessment and Plan     Michele Hopkins is a 77 y.o. 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 Today is Hospital Day 3.     I note the following medical issues:    #Acute L Basal Ganglia Ischemic Stroke  #Chronic Small Vessel Disease  #Bilateral Carotid bulb atherosclerosis, R ICA stenosis  Presented with R-sided hemiparesis and sensory loss. Reportedly had an NIHSS1 on arrival to OSH; [place] on arrival to [facility]. OSH CTH notable for several new, small, low-density areas within the pons / brainstem that could be acute or subacute infarcts vs artifact. Patient was outside window for TNK (LKW was 4/7). Potential etiologies include cardioembolic vs thromboembolic vs large vessel atherosclerosis vs chronic microvascular disease vs cryptogenic. Risk stratification labs: A1c 5.2%, LDL 112. TTE showed no shunt or vegetation. MRI significant for two punctate foci of acute ischemia in the left basal ganglia. This location is consistent with chronic  small vessel disease, like related to poorly controlled HTN and significant smoking history.   - Continue CMU tele  - Neurochecks Q4h  - Secondary stroke prevention: ASA 81 mg daily, Atorvastatin 80 mg QHS  - Will not discharge with an event monitor  - PT/OT/SLP - recommend IPR  - Stroke Nurse consult    #CAD s/p PCI to LAD  #HTN  Patient instructed to take daily ASA 81 mg, but frequently forgets and not listed in home medications. ECG from OSH normal sinus rhythm without ischemic changes. Troponin in normal range. TTE: EF 55-60%, no LVH, no RWMA, no identified thrombus, no shunt  - Continue ASA and Atorvastatin  - Continue home Lisinopril 20 mg daily, Metolazone 5 mg daily    #COPD  #Tobacco Use Disorder  #Incidental pulmonary nodule  Home regimen includes Albuterol inhaler as-needed and Advair 1 puff BID. Does not use oxygen at baseline. CT neck from OSH noted incidental 2.2 cm pulmonary nodule in the right apex of the lung. Patient will need CT chest for further evaluation of incident pulmonary nodule.  - Continue home inhalers  - Monitor WOB  - Nicotine gum PRN  - Consider smoking cessation counseling  - Will need CT Chest outpatient    #HLD  Lipid Profile: Triglyc 122, HDL 46, LDL 112, T-Chol 182,   - Continue home Atorvastatin    #CKD Stage III  Baseline Cr unknown due to lack of medical records. However, documented CKD stage 3 from OSH records.  Cr 0.75, BUN 15 on admission.   - Avoid nephrotoxic medications    #GERD  - Continue home Pantoprazole    #Neuropsych  - Continue home Sertraline, Gabapentin      Inpatient Checklist:    Diet:  Diet/Nutrition Orders    Diet Regular(7)     Frequency: Effective Now     Number of Occurrences: Until Specified     Order Questions:      Suicide/Behavior Risk Modification? No     IV Fluids: None  Lines: PIV  Foley: No  Telemetry: Yes - stroke  Pain management: Acetaminophen PRN  DVT prophylaxis: SQH  GI prophylaxis: PPI  Bowel Regimen: Suppository PRN  Precautions/Isolation:  None  Ambulation: as tolerated  Ancillary services: PT/OT/SLP/SW  Code Status: Full Code  Dispo: Floor         PT Intervention Plan: Treatment/Interventions: LE strengthening/ROM, Therapeutic Activity, Therapeutic Exercise, Endurance training, Patient/family training, Equipment eval/education, Gait training, Press photographer, Stair Training  PT Frequency: minimum 3x/week       PT recs: Recommendation: IP Rehab  Equipment Recommended: Defer to facility to obtain, Rolling walker       OT Interventions Plan: Treatment Interventions: Activity Tolerance training, ADL retraining, IADL retraining, Energy Conservation, Equipment eval/education, Cognitive reorientation, Neuro muscular reeducation, Functional transfer training, Fine motor coordination activities, Patient/Family training, UE strengthening/ROM, Therapeutic Activity, Compensatory technique education, Excercise  OT Frequency: minimum 3x/week       OT recs: Recommendation: IP Rehab  Equipment Recommendations: Defer at this time       SLP recs: Diagnosis: WFL      Signed:    Karie Mainland, MD, PhD  [facility] Pediatric Neurology Resident, PGY-3  07/14/2022

## 2022-07-14 NOTE — Progress Notes (Signed)
Occupational Therapy  Treatment     Name: Michele Hopkins  DOB: [birth year]  Attending Physician: Marchelle Folks, MD  Admission Diagnosis: possible stroke  Date: 07/14/2022  Room: 0981/X9147  Reviewed Pertinent hospital course: Yes   Hospital Course PT/OT: 77 y/o F p/w R sided weakness. OSH CT head: several new small low-density areas in brainstem, acute/subacute infarcts vs artifact. 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), CAD s/p PCI to LAD (2005), PAD, COPD, tobacco use, HLD, HTN, and CKD stage 3  Precautions: none  Activity Level: Activity as tolerated    Assist: None      Recommendation  Recommendation: IP Rehab  Equipment Recommendations: Defer at this time      Assessment  Assessment: Decreased IADLs, Decreased Functional Mobility, Decreased activity tolerance, Decreased ADL status, Decreased Balance, Decreased self-care transfers, Decreased fine motor control, Decreased UE strength          Prognosis for OT goals: Good                        Pt is supine in bed upon arrival, agreeable to treatment session. Pt with fair tolerance to session meeting multiple goals with completion of toileting tasks. Pt is limited by activity tolerance, ataxia, balance, coordination, global deconditioning, global weakness, right sided weakness, and strength requiring physical assistance and/or supervision with all  ADLs and functional mobility this date. Encouraged pt to complete frequent mobility with RN staff while admitted to progress strength and activity tolerance outside of therapy sessions; pt reports minimal mobility since therapy evaluation. Recommend IPR upon discharge from acute services. Pt is currently performing below previous level of function and requires intensive and coordinated skilled services to facilitate safe discharge back to previous living environment. Pt demonstrates ability to tolerate IPR therapy schedule and is motivated to participate in  continued therapy services.     Outcome Measures  AM-PAC 6 Clicks Daily Activity Inpatient Short Form: OT 6 Clicks Score: 15     Cognition  Overall Cognitive Status: Within Functional Limits  Cognitive Assessment: Arousal/ Alertness;Communication;Orientation Level;Behavior;Following Commands;Safety Judgment;Insight  Arousal/Alertness: Alert  Orientation Level: Oriented to person;Oriented to place;Oriented to situation (further not assessed)  Behavior: Appropriate;Cooperative (pleasant)  Following Commands: Follows all commands and directions without difficulty  Safety Judgment: Good awareness of safety precautions  Insight: Demonstrated intact insight into limitation and abilities to complete ADL's safely  Communication: Verbalization    Pain  Pain Location: Back (+ hips)  Pain Intervention(s): Ambulation/increased activity;Emotional support;Repositioned    Exercises  Exercises  Hand Strengthening: Graded Pinchpins  Hand Strengthening Comment: pt completed x3 reps of placing graded pinchpins on metal rods of various heights to improve coordination and dexterity for ADLs and IADLs. pt completed x3 reps of removing and replacing 10 pins. pt completed seated in chair and endorses task being difficult  Fine Motor Exercises: pt completed in reassessment of finger to nose task with R hand; demo's improvement from evaluation but continues to have decreased coordination.    Functional Mobility  Bed Mobility  Supine to Sit: Stand-by assistance;head of bed elevated;towards the right;increased time to complete task (effortful)  Functional Transfers  Sit to Stand: Minimal assistance;up to assistive device  Sit to Stand Assistive Device: Single-point cane  Stand to Sit: Contact Guard assistance;with assistive device  Stand to Sit Assistive Device: Single-point cane  Toilet Transfers: Contact guard assistance;grab bar;increased time to complete task;with assistive device  Chartered loss adjuster Device: Single-point  cane  Functional Mobility: Minimal assistance;with assistive device;increased time to complete task  Functional Mobility Assistive Device: Single-point cane  Functional Mobility Comment: a household distance in room with min A and use of SPC; pt intermittently using therapist hand for support or reaching out for objects in environment of external support  Balance  Sitting - Static: Supervision  Sitting - Dynamic: Stand by assistance  Standing - StaticContractor;With Assistive Device   Standing-Static Assistive Device: Single-point cane  Standing - Dynamic: Minimal Assistance;With Assistive Device  Standing-Dynamic Assistive Device: Single-point cane    Gait belt used: Yes    ADL  Grooming: Contact guard assistance  Grooming Deficit: Set-up;Steadying;Wash/dry hands;Standing with assistive device  Location Assessed Grooming: Standing at sink  Toileting: Moderate assistance  Toileting Deficit: Steadying;Verbal cueing;Supervison/safety;Clothing management down;Clothing management up;Toileting hygiene;Increased time to complete;Grab bar use  Location Assessed Toileting: Toilet  Toileting Deficit Additional Comments: pt able to complete clothing management down with min A for balance; completes bladder hygiene while seated on commode. pt requires assistance with donning clean brief in stance       Position after Treatment/Safety Handoff  Position after therapy session: Recliner  Details: RN notified;Call light/ needs within reach  Alarms: Chair  Alarms Status: Activated and Interfaced with call system    Goals  Goals to be met in: one week  Patient stated goal: to go home  Patient will complete toilet transfer: Contact Guard assistance (goal met and upgraded 4/16)  Patient will complete toileting: Minimal assistance (goal met and upgraded 4/16)  Patient will complete lower body dressing: Will tolerate assessment  Pt Will participate in upper extremity HEP to prep for ADLs: RUE (goal met; continue  4/16)  Miscellaneous Goal #3: Pt will complete STS with CGA and LRAD in prep for OOB ADLs  Miscellaneous Goal #5: Pt will participate in functional cognition assessment  Long Term Goal : Pt will complete bathing assessment  Long term goal to be met in: two weeks  Collaborated with: Patient    Plan  Plan  Treatment Interventions: Activity Tolerance training, ADL retraining, IADL retraining, Energy Conservation, Equipment eval/education, Cognitive reorientation, Neuro muscular reeducation, Functional transfer training, Fine motor coordination activities, Patient/Family training, UE strengthening/ROM, Therapeutic Activity, Compensatory technique education, Excercise  OT Frequency: minimum 3x/week  The plan of care and recommendations assesses the patient's and/or caregiver's readiness, willingness, and ability to provide or support functional mobility and ADL tasks as needed upon discharge.    Patient/Family Education  Educated patient on the role of occupational therapy, OT goals, OT plan of care, discharge recommendation, ADL training, energy conservation techniques, functional mobility training, and the importance of safety and fall prevention strategies including need for supervision/ assistance with OOB activity and use of call light. patient  verbalized understanding.    OT Time  Start Time: 1447  Stop Time: 1525  Time Calculation (min): 38 min    OT Charges     $Therapeutic Exercise: 8-22 mins  $Self Care/ADL/Home Management Training: 23-37 mins       Problem List  Patient Active Problem List   Diagnosis    Stroke (CMS-HCC)    COPD (chronic obstructive pulmonary disease) (CMS-HCC)    HTN (hypertension)    HLD (hyperlipidemia)    CKD (chronic kidney disease) stage 3, GFR 30-59 ml/min (CMS-HCC)     Past Medical History  Past Medical History:   Diagnosis Date    CAD (coronary artery disease)  Carotid arterial disease (CMS-HCC)     Chronic pain syndrome     CKD (chronic kidney disease) stage 3, GFR 30-59 ml/min  (CMS-HCC)     CVA (cerebral vascular accident) (CMS-HCC)     Depression     Dysphagia     GERD with esophagitis     History of bladder cancer     HLD (hyperlipidemia)     HTN (hypertension)     Idiopathic peripheral neuropathy     PAD (peripheral artery disease) (CMS-HCC)     Solitary pulmonary nodule present on computed tomography of lung     TMJ syndrome     Vitamin D deficiency      Past Surgical History  Past Surgical History:   Procedure Laterality Date    APPENDECTOMY      CHOLECYSTECTOMY      COLONOSCOPY  06/26/2022    CORONARY ANGIOPLASTY WITH STENT PLACEMENT  2005    LAD    ESOPHAGOGASTRODUODENOSCOPY  06/26/2022    HERNIA REPAIR  02/2022    Left inguinal    HYSTERECTOMY      REPLACEMENT TOTAL KNEE Right

## 2022-07-14 NOTE — Consults (Signed)
20G PIV placed to LFA.

## 2022-07-15 MED ORDER — polyethylene glycol (MIRALAX) packet 17 g
17 | Freq: Every day | ORAL
Start: 2022-07-15 — End: 2022-07-16
  Administered 2022-07-15 – 2022-07-16 (×2): 17 g via ORAL

## 2022-07-15 MED ORDER — bisacodyL (DULCOLAX) suppository 10 mg
10 | Freq: Every day | RECTAL | PRN
Start: 2022-07-15 — End: 2022-07-22
  Administered 2022-07-16 – 2022-07-20 (×2): 10 mg via RECTAL

## 2022-07-15 MED FILL — HEPARIN (PORCINE) 5,000 UNIT/ML INJECTION SOLUTION: 5000 5,000 unit/mL | INTRAMUSCULAR | Qty: 1

## 2022-07-15 MED FILL — PANTOPRAZOLE 40 MG TABLET,DELAYED RELEASE: 40 40 MG | ORAL | Qty: 1

## 2022-07-15 MED FILL — VITAMIN C 500 MG TABLET: 500 500 MG | ORAL | Qty: 1

## 2022-07-15 MED FILL — VITAMIN B-12  1,000 MCG TABLET: 1000 1000 MCG | ORAL | Qty: 1

## 2022-07-15 MED FILL — GABAPENTIN 100 MG CAPSULE: 100 100 MG | ORAL | Qty: 2

## 2022-07-15 MED FILL — LISINOPRIL 10 MG TABLET: 10 10 MG | ORAL | Qty: 2

## 2022-07-15 MED FILL — ASPIRIN 81 MG CHEWABLE TABLET: 81 81 MG | ORAL | Qty: 1

## 2022-07-15 MED FILL — TYLENOL 325 MG TABLET: 325 325 mg | ORAL | Qty: 2

## 2022-07-15 MED FILL — ATORVASTATIN 80 MG TABLET: 80 80 MG | ORAL | Qty: 1

## 2022-07-15 MED FILL — METOLAZONE 5 MG TABLET: 5 5 MG | ORAL | Qty: 1

## 2022-07-15 MED FILL — NICOTINE 14 MG/24 HR DAILY TRANSDERMAL PATCH: 14 14 mg/24 hr | TRANSDERMAL | Qty: 1

## 2022-07-15 MED FILL — SERTRALINE 50 MG TABLET: 50 50 MG | ORAL | Qty: 1

## 2022-07-15 MED FILL — POLYETHYLENE GLYCOL 3350 17 GRAM ORAL POWDER PACKET: 17 17 gram | ORAL | Qty: 1

## 2022-07-15 NOTE — Progress Notes (Signed)
Physical Therapy  Physical Therapy  Treatment    Name: Michele Hopkins  DOB: 1945-12-21  Attending Physician: Marchelle Folks, MD  Admission Diagnosis: possible stroke  Date: 07/15/2022  Room: 2956/O1308  Reviewed Pertinent hospital course: Yes    Hospital Course PT/OT: 77 y/o F p/w R sided weakness. OSH CT head: several new small low-density areas in brainstem, acute/subacute infarcts vs artifact. 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), CAD s/p PCI to LAD (2005), PAD, COPD, tobacco use, HLD, HTN, and CKD stage 3  Precautions: none  Activity Level: Activity as tolerated    Assist: None    Assessment  Pt fatigues quickly with gait training and is limited by endurance. Pt balance during ambulation improving with bilateral UE support via walker. Pt motivated and willing participant in rehab process. Pt appropriate and able to tolerate three hours therapy daily.         Recommendation  Recommendation: IP Rehab  Equipment Recommended: Defer to facility to obtain, Rolling walker    Justification for DME ordered: Lyda Perone: Patient has decreased weight bearing or impaired balance putting them at risk for falling without use of a walker. They are unable to utilize crutches or a cane to provide adequate support    AM-PAC 6 Clicks Basic Mobility Inpatient Short Form: PT 6 Clicks Score: 16     Mobility Recommendations for Staff  Patient ability: Patient ambulates in room/ to bathroom  Assist needed: with 1 person assist  Equipment/ Precautions needed: Requires assistive device, use gait belt  Requires Assistive Device: Rolling walker    Cognition  Overall Cognitive Status: Within Functional Limits  Arousal/Alertness: Alert  Orientation Level: Oriented X4  Behavior: Appropriate;Cooperative  Following Commands: Follows all commands and directions without difficulty     Pain  Pain Score: 3   Pain Location: Back  Pain Descriptors: Aching  Pain Intervention(s):  Repositioned;Ambulation/increased activity     Mobility  Bed Mobility  Rolling: Contact Guard assistance;increased time to complete task;towards the right  Supine to Sit: Contact Guard assistance;increased time to complete task;head of bed elevated;towards the right  Transfers  Sit to Stand: Minimal assistance;up to assistive device;increased time to complete task;cues for hand placement  Sit to Stand Assistive Device: Rolling walker  Stand to Sit: Contact Guard assistance;with assistive device  Stand to Sit Assistive Device: Rolling walker  Bed to Chair: Contact Guard assistance;stand step;with assistive device  Bed to Chair Assistive Device: Rolling walker  Gait  Distance: 15+15  Level of Assistance: Film/video editor Device: Rolling walker  Gait Characteristics: step-to pattern;decreased cadence;Shuffling;R decreased step length;L decreased step length;Increased trunk flexion;Narrow BOS    Gait belt used: Yes    Outcome Measures       Exercise  Seated  Exercises: Ankle pumps;Quad sets;Gluteal sets;Hip flexion;Long arc quads;Knee flexion;ABduction;ADduction;Left LE;Right LE;10 reps              Position after Treatment and Safety Handoff  Position after treatment and safety handoff  Position after therapy session: Recliner  Details: Call light/ needs within reach  Alarms: Chair  Alarms Status: Activated and Interfaced with call system    Goals  Goals Met: None    Collaborated with: Patient  Patient Stated Goal: to return to baseline/PLOF  Goals to be met by: 07/20/22  Patient will transition from supine to sit: Supervision  Patient will transition from sit to supine: Supervision  Patient will  transfer from sit to stand: Contact Guard assistance, up to assistive device (RW)  Patient will ambulate: Contact Guard assistance, with assistive device, distance (in feet)   Ambulation Assistance Device: Rolling walker  Distance (in feet): 50  Patient will go up / down stairs: Will tolerate  assessment  Patient will  participate in bilateral lower extremeity HEP in preperation for further functional mobility: 10 repetitions  Pt Will report pain with functional mobility at: 4/10 or less  Long-term goal to be met by: 07/27/22  Long Term Goal : Patient will ambulate 125 ft with SBA, RW    Patient/Family Education  Educated patient on the role of physical therapy, goals, plan of care, importance of increased activity, and discharge recommendations and fall prevention strategies, including need for supervision/ assistance with OOB activity and use of call light; patient verbalized understanding and demonstrated understanding.     Plan  Plan  Treatment/Interventions: LE strengthening/ROM, Therapeutic Activity, Therapeutic Exercise, Endurance training, Patient/family training, Equipment eval/education, Gait training, Neuromuscular Reeducation, Stair Training  PT Frequency: minimum 3x/week    The plan of care and recommendations assesses the patient's and/or caregiver's readiness, willingness, and ability to provide or support functional mobility and ADL tasks as needed upon discharge.      Time  Start Time: 1331  Stop Time: 1409  Time Calculation (min): 38 min    Charges     $Gait/Mobility: 8-22 mins  $Therapeutic Exercise: 8-22 mins  $Therapeutic Activity: 1 unit             Problem List  Patient Active Problem List   Diagnosis    Stroke (CMS-HCC)    COPD (chronic obstructive pulmonary disease) (CMS-HCC)    HTN (hypertension)    HLD (hyperlipidemia)    CKD (chronic kidney disease) stage 3, GFR 30-59 ml/min (CMS-HCC)        Past Medical History  Past Medical History:   Diagnosis Date    CAD (coronary artery disease)     Carotid arterial disease (CMS-HCC)     Chronic pain syndrome     CKD (chronic kidney disease) stage 3, GFR 30-59 ml/min (CMS-HCC)     CVA (cerebral vascular accident) (CMS-HCC)     Depression     Dysphagia     GERD with esophagitis     History of bladder cancer     HLD (hyperlipidemia)     HTN  (hypertension)     Idiopathic peripheral neuropathy     PAD (peripheral artery disease) (CMS-HCC)     Solitary pulmonary nodule present on computed tomography of lung     TMJ syndrome     Vitamin D deficiency         Past Surgical History  Past Surgical History:   Procedure Laterality Date    APPENDECTOMY      CHOLECYSTECTOMY      COLONOSCOPY  06/26/2022    CORONARY ANGIOPLASTY WITH STENT PLACEMENT  2005    LAD    ESOPHAGOGASTRODUODENOSCOPY  06/26/2022    HERNIA REPAIR  02/2022    Left inguinal    HYSTERECTOMY      REPLACEMENT TOTAL KNEE Right

## 2022-07-15 NOTE — Nursing Note (Signed)
Stroke Nurse Navigator Note:  Reviewed personalized stroke booklet with patient.  Discussed stroke.  Discussed stroke risk factors of high blood pressure, high cholesterol and smoking.  Mutually agreed upon goal:   smoking cessation.  Discussed importance of taking medications as prescribed, especially Aspirin and Atorvastatin.  Reviewed cholesterol levels and the use of a statin for secondary stroke prevention.  Reviewed BEFAST and when to call 911.  Discussed following up with PCP within 1-2 weeks once home.  Gave stroke follow up appointment info with Dr. Mary Sella.  Stroke Nurse Navigator contact info given for questions.       Future Appointments   Date Time Provider Department Center   10/15/2022 10:00 AM Shellia Cleverly, MD [facility]

## 2022-07-15 NOTE — Care Coordination-Inpatient (Addendum)
[facility]  Case Management/Social Work Department  Progress Note    Patient Information     Patient Name: Dava Rensch  MRN: [id number]  Hospital day: 4  Inpatient/Observation:  Inpatient   Level of Care:  Floor  Admit date:  07/11/2022  Admission diagnosis: possible stroke    PMH:  has a past medical history of CAD (coronary artery disease), Carotid arterial disease (CMS-HCC), Chronic pain syndrome, CKD (chronic kidney disease) stage 3, GFR 30-59 ml/min (CMS-HCC), CVA (cerebral vascular accident) (CMS-HCC), Depression, Dysphagia, GERD with esophagitis, History of bladder cancer, HLD (hyperlipidemia), HTN (hypertension), Idiopathic peripheral neuropathy, PAD (peripheral artery disease) (CMS-HCC), Solitary pulmonary nodule present on computed tomography of lung, TMJ syndrome, and Vitamin D deficiency.    PCP:  ATTENDING PROVIDER UNKNOWN    Home Pharmacy:    [facility]  [address]  [place] [address]  Phone: [phone]         Medical Insurance Coverage:  Payor: [phone] MANAGED MEDICARE / Plan: [employer] CHOICE PPO MEDICARE / Product Type: Medicare Mngd Care /     Other Pertinent Information     SW received an update from interdisciplinary team. Patient is medically ready for discharge.     Still need selections form patient, will need pre-cert.      Discharge Plan     Anticipated discharge plan:  IPR     Anticipated discharge date:  4/22     CM/SW will continue to follow and remain available for discharge planning needs.      Candee Furbish, MSW, LSW  [phone]

## 2022-07-15 NOTE — Plan of Care (Signed)
Problem: Safety  Goal: Patient will be injury free during hospitalization  Description: Assess and monitor vitals signs, neurological status including level of consciousness and orientation. Assess patient's risk for falls and implement fall prevention plan of care and interventions per hospital policy.      Ensure arm band on, uncluttered walking paths in room, adequate room lighting, call light and overbed table within reach, bed in low position, wheels locked, side rails up per policy, and non-skid footwear provided.   Outcome: Progressing  Goal: Patient with weight > 350lbs will have appropriate equipment  Description: Consider ordering Bariatric Bed, Chair and Bedside Commode for patient weight > 350 lbs.  Outcome: Progressing     Problem: Patient will remain free of falls  Goal: Universal Fall Precautions  Outcome: Progressing     Problem: Daily Care  Goal: Daily care needs are met  Description: Assess and monitor ability to perform self care and identify potential discharge needs.  Outcome: Progressing     Problem: Psychosocial Needs  Goal: Demonstrates ability to cope with hospitalization/illness  Description: Assess and monitor patients ability to cope with his/her illness.  Outcome: Progressing     Problem: Discharge Barriers  Goal: Patient's discharge needs are met  Description: Collaborate with interdisciplinary team and initiate plans and interventions as needed.   Outcome: Progressing     Problem: Patient will remain free of injury d/t fall  Goal: High Fall Risk Precautions  Outcome: Progressing  Goal: Additional High Fall Risk Precautions (consider the following)  Outcome: Progressing     Problem: Knowledge Deficit  Goal: Patient/family/caregiver demonstrates understanding of disease process, treatment plan, medications, and discharge instructions  Description: Complete learning assessment and assess knowledge base.  Outcome: Progressing     Problem: Potential for Compromised Skin Integrity  Goal:  Skin integrity is maintained or improved  Description: Assess and monitor skin integrity. Identify patients at risk for skin breakdown on admission and per policy. Collaborate with interdisciplinary team and initiate plans and interventions as needed.  Outcome: Progressing  Goal: Nutritional status is improving  Description: Monitor and assess patient for malnutrition (ex- brittle hair, bruises, dry skin, pale skin and conjunctiva, muscle wasting, smooth red tongue, and disorientation). Collaborate with interdisciplinary team and initiate plan and interventions as ordered.  Monitor patient's weight and dietary intake as ordered or per policy. Utilize nutrition screening tool and intervene per policy. Determine patient's food preferences and provide high-protein, high-caloric foods as appropriate.   Outcome: Progressing     Problem: Incontinence  Goal: Perineal skin integrity is maintained or improved  Description: Assess genitourinary system, perineal skin, labs (urinalysis), and history of incontinence to include past management, aggravating, and alleviating factors.  Collaborate with interdisciplinary team and initiate plans and interventions as needed.  Outcome: Progressing     Problem: Acute Pain  Description: Patient's pain progressing toward patient's stated pain goal  Goal: Patient displays improved well-being such as baseline levels for pulse, BP, respirations and relaxed muscle tone or body posture  Outcome: Progressing  Goal: Patient will manage pain with the appropriate technique/intervention  Description: Assess and monitor patient's pain using appropriate pain scale. Collaborate with interdisciplinary team and initiate plan and interventions as ordered.  Re-assess patient's pain level 30-60 minutes after pain management intervention.  Outcome: Progressing  Goal: Patient will reduce or eliminate use of analgesics  Outcome: Progressing  Goal: Patients pain is managed to allow active participation in daily  activities  Outcome: Progressing  Goal: Patient verbalizes a reduction in  pain level  Outcome: Progressing  Goal: Discharge Pain Management Plan (Acute Pain)  Outcome: Progressing     Problem: Chronic Pain  Description: Patient's pain progressing toward patient's stated pain goal  Goal: Pt will have limited adverse effects related to chronic pain  Description: i.e. Depression, opioid induced constipation, respiratory depression  Outcome: Progressing  Goal: Patient will manage pain with the appropriate technique/intervention  Description: Assess and monitor patient's pain using appropriate pain scale. Collaborate with interdisciplinary team and initiate plan and interventions as ordered.  Re-assess patient's pain level 30-60 minutes after pain management intervention.  Outcome: Progressing  Goal: Patient will reduce or eliminate use of analgesics  Outcome: Progressing  Goal: Patients pain is managed to allow active participation in daily activities  Outcome: Progressing  Goal: Patient verbalizes a reduction in pain level  Outcome: Progressing  Goal: Discharge Pain Management Plan (Chronic Pain)  Outcome: Progressing

## 2022-07-15 NOTE — Progress Notes (Signed)
[facility]  Department of Neurology and Rehab Medicine  Inpatient Progress Note    Chief Complaint / Brief Hospital Course     Michele Hopkins is a 77 y.o. 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 Had a fall from the side of the bed on 4/8 without LOC; she reported feeling unsteady. After the fall, experienced R-sided weakness involving hand, arm, face. Symptoms began to improve so she did not seek medical evaluation. On 4/13, she sat down to drink coffee and was unable to stand up nor move the R side of her body, trouble speaking and swallowing. First evaluated at OSH with [facility] revealing several new small low-density areas within the brainstem. CTA neck dense atheromatous calcification bilateral carotid bulbs and right proximal ICA (less than 50% reduction of origin of the right ICA). MRI with two small acute infarcts in the L basal ganglia. Incidental lung nodule further assessed on CT Chest with concern for malignancy.    Today is Hospital Day 4.    Interval History / Subjective     No acute events overnight, no new neurologic concerns. SBP 90s-110s. Had MRI yesterday that was significant for small acute infarct in the L basal ganglia    Review of Systems (focused)     Constitutional: Negative for fever.   Respiratory: Negative for cough and shortness of breath.  Cardiovascular: Negative for chest pain, palpitations  Gastrointestinal: Negative for abdominal pain, N/V, diarrhea and constipation.  Neurological: Negative for new numbness, tingling, or focal weakness    Medications   I personally reviewed all scheduled and PRN medications. All allergies were reviewed and updated.  See MAR for additional details.    Scheduled Medications:   ascorbic acid (vitamin C)  500 mg Oral Daily 0900    aspirin  81 mg Oral Daily  with breakfast    atorvastatin  80 mg Oral Nightly (2100)    budesonide-formoteroL  2 puff Inhalation RT BID    cyanocobalamin  1,000 mcg Oral Daily 0900    ergocalciferol  50,000 Units Oral Weekly    gabapentin  200 mg Oral BID    heparin  5,000 Units Subcutaneous 3 times per day    lisinopriL  20 mg Oral Daily 0900    metOLazone  5 mg Oral Daily 0900    nicotine  1 patch Transdermal Daily 0900    pantoprazole  40 mg Oral QAM AC    sertraline  25 mg Oral Daily 0900       Current Facility-Administered Medications   Medication Dose Frequency Provider Last Admin    acetaminophen  650 mg Q4H PRN Chanetta Marshall, MD 650 mg at 07/15/22 1610    Or    acetaminophen  650 mg Q4H PRN Chanetta Marshall, MD      albuterol  2.5 mg RT Q4H PRN Chanetta Marshall, MD      ascorbic acid (vitamin C)  500 mg Daily 0900 Chanetta Marshall, MD 500 mg at 07/14/22 1022    aspirin  81 mg Daily with breakfast Chanetta Marshall, MD 81 mg at 07/14/22 0950    atorvastatin  80 mg Nightly (2100) Chanetta Marshall, MD 80 mg at 07/14/22 2046    budesonide-formoteroL  2 puff RT BID Chanetta Marshall, MD 2 puff at 07/14/22 2018  cyanocobalamin  1,000 mcg Daily 0900 Chanetta Marshall, MD 1,000 mcg at 07/14/22 1022    ED albuterol  2 puff RT Q4H PRN Chanetta Marshall, MD      ergocalciferol  50,000 Units Weekly Chanetta Marshall, MD 50,000 Units at 07/12/22 1119    gabapentin  200 mg BID Chanetta Marshall, MD 200 mg at 07/14/22 2046    guaiFENesin  600 mg BID PRN Ruthe Mannan, MD 600 mg at 07/13/22 1606    heparin  5,000 Units 3 times per day Chanetta Marshall, MD 5,000 Units at 07/15/22 0521    lisinopriL  20 mg Daily 0900 Chanetta Marshall, MD 20 mg at 07/14/22 1023    metOLazone  5 mg Daily 0900 Chanetta Marshall, MD 5 mg at 07/14/22 1022    nicotine  1 patch Daily 0900 Chanetta Marshall, MD 1 patch at 07/14/22 1022    And    nicotine (polacrilex)  2 mg Q1H PRN Chanetta Marshall, MD 2 mg at 07/13/22 0834    ondansetron  4 mg Q6H PRN Chanetta Marshall, MD      pantoprazole   40 mg QAM AC Chanetta Marshall, MD 40 mg at 07/15/22 0548    sertraline  25 mg Daily 0900 Chanetta Marshall, MD 25 mg at 07/14/22 1022       Vital Signs and Intake/Output   All vital signs during the previous 24 hours reviewed.     Temp:  [97 F (36.1 C)-98 F (36.7 C)] 97.3 F (36.3 C)  Heart Rate:  [59-74] 59  Resp:  [16-19] 19  BP: (80-132)/(50-71) 103/61      Intake/Output Summary (Last 24 hours) at 07/15/2022 0750  Last data filed at 07/14/2022 1722  Gross per 24 hour   Intake --   Output 750 ml   Net -750 ml       Physical Exam     General Medical Exam  CONSTITUTIONAL: well-appearing adult women in no acute distress, appears stated age  EYES: no conjunctival injection or scleral icterus  CV: warm and well perfused, no significant peripheral edema  RESP: no accessory muscle use, no increased WOB on nasal cannula, frequent coughing productive for mucous  SKIN: warm, dry, intact with normal turgor, no appreciable lesions    Neurologic Exam  Mental Status:   Level of consciousness: alerts spontaneously and to voice  Orientation: oriented to person, place, day, situation  Attention/concentration: able to attend exam well  Language: normal fluency/word-finding in conversation, no notable paraphasic errors, able to follow multi-step commands.  Speech: clear, coherent, goal-directed, spontaneous, without notable dysarthria  No left-right confusion. No signs of neglect on exam    Cranial Nerves:  CN II: Visual fields grossly intact, tracks across visual fields.  CN III/IV/VI: Gaze conjugate, EOMI with normal saccades/smooth pursuit, no nystagmus  CN V: Facial sensation to light touch intact in V1, V2; subjectively diminished on R relative to L in V3  CN VII: Facial motion and strength intact and symmetric   CN VIII: Hearing grossly intact to conversation  CN IX/X: Strong phonation, cough  CN XI: Sternocleidomastoid strong bilaterally  CN XII: Tongue protrudes midline    Motor:   Decreased bulk appropriate to age.  No rest  tremor or adventitial movements observed.    No pronator drift.     Strength: Raises and holds antigravity for all four extremities, but difficulty raising RLE to 45 degrees and drifts down    Coordination:  No dysmetria on finger-to-nose. Able to perform finger  tapping.    Reflexes: Not tested today    Sensory: Reports diminished sensation on RUE/RLE relative to the L    Gait and Station:  Station: normal truncal control, rises from bed without assist.  Gait: normal gait and base with standard walk.      Diagnostic Studies   All diagnostic studies over the past 24 hours were reviewed by me and the attending. Pertinent findings discussed below in the A&P.    Labs:        Lab 07/12/22  0054   SODIUM 139   POTASSIUM 3.0*   CHLORIDE 98   CO2 31   BUN 15   CREATININE 0.75   GLUCOSE 111*         Lab 07/12/22  0054   WBC 5.7   HEMOGLOBIN 13.6   HEMATOCRIT 39.3   MEAN CORPUSCULAR VOLUME 85.0   PLATELETS 214       Radiology:    [facility] and CTA performed at OSH    MRI Brain (07/13/2022)    IMPRESSION:   1.  2 punctate foci of acute ischemia in the left basal ganglia measuring less than 1 mm each.       CT Chest (07/14/2022)  IMPRESSION:     Irregular spiculated nodule in the right upper lobe, indeterminate but suspicious for primary malignancy. CT PET and/or tissue sampling may be considered. At minimum, comparison with any available outside imaging and close follow-up is recommended.     Limited number of additional smaller nodules are nonspecific and can be reassessed on follow-up.     Bibasilar areas of dependent consolidation associated with retained airway secretions, likely atelectasis or scar. No specific findings to suggest superimposed pneumonia.       Assessment and Plan     Kelisha Dall is a 77 y.o. 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 Today is Hospital Day 4.     I note the  following medical issues:    #Acute L Basal Ganglia Ischemic Stroke  #Chronic Small Vessel Disease  #Bilateral Carotid bulb atherosclerosis, R ICA stenosis  Presented with R-sided hemiparesis and sensory loss. Reportedly had an NIHSS1 on arrival to OSH; [place] on arrival to [facility]. OSH CTH notable for several new, small, low-density areas within the pons / brainstem that could be acute or subacute infarcts vs artifact. Patient was outside window for TNK (LKW was 4/7). MRI significant for two punctate foci of acute ischemia in the left basal ganglia (did not clearly show brainstem infarct). Potential etiologies include cardioembolic vs thromboembolic vs large vessel atherosclerosis vs chronic microvascular disease vs cryptogenic. Risk stratification labs: A1c 5.2%, LDL 112. TTE showed no shunt or vegetation. Deep nuclei location is consistent with chronic small vessel disease, likely related to poorly controlled HTN and significant smoking history.   - Continue CMU tele  - Neurochecks Q4h  - Secondary stroke prevention: ASA 81 mg daily, Atorvastatin 80 mg QHS  - Will not discharge with an event monitor  - PT/OT/SLP - recommend IPR  - Stroke Nurse consult    #CAD s/p PCI to LAD  #HTN  Patient instructed to take daily ASA 81 mg, but frequently forgets and not listed in home medications. ECG from OSH normal sinus rhythm without ischemic changes. Troponin in normal range. TTE: EF 55-60%, no LVH, no RWMA, no identified thrombus,  no shunt  - Continue ASA and Atorvastatin  - Continue home Lisinopril 20 mg daily, Metolazone 5 mg daily    #COPD  #Tobacco Use Disorder  #Incidental RUL pulmonary nodule  Home regimen includes Albuterol inhaler as-needed and Advair 1 puff BID. Does not use oxygen at baseline. CT neck from OSH noted incidental 2.2 cm pulmonary nodule in the right apex of the lung. Further investigated with CT Chest (4/16) that is concerning - irregular spiculated nodule in the right upper lobe, indeterminate but  suspicious for primary malignancy.  - Continue home inhalers  - Monitor WOB  - Nicotine gum PRN  - Consider smoking cessation counseling  - Will need outpatient malignancy work-up for lung nodule    #HLD  Lipid Profile: Triglyc 122, HDL 46, LDL 112, T-Chol 182,   - Continue home Atorvastatin    #CKD Stage III  Baseline Cr unknown due to lack of medical records. However, documented CKD stage 3 from OSH records. Cr 0.75, BUN 15 on admission.   - Avoid nephrotoxic medications    #GERD  - Continue home Pantoprazole    #Neuropsych  - Continue home Sertraline, Gabapentin      Inpatient Checklist:    Diet:  Diet/Nutrition Orders    Diet Regular(7)     Frequency: Effective Now     Number of Occurrences: Until Specified     Order Questions:      Suicide/Behavior Risk Modification? No     IV Fluids: None  Lines: PIV  Foley: No  Telemetry: Yes - stroke  Pain management: Acetaminophen PRN  DVT prophylaxis: SQH  GI prophylaxis: PPI  Bowel Regimen: Suppository PRN  Precautions/Isolation: None  Ambulation: as tolerated  Ancillary services: PT/OT/SLP/SW  Code Status: Full Code  Dispo: Floor         PT Intervention Plan: Treatment/Interventions: LE strengthening/ROM, Therapeutic Activity, Therapeutic Exercise, Endurance training, Patient/family training, Equipment eval/education, Gait training, Press photographer, Stair Training  PT Frequency: minimum 3x/week       PT recs: Recommendation: IP Rehab  Equipment Recommended: Defer to facility to obtain, Rolling walker       OT Interventions Plan: Treatment Interventions: Activity Tolerance training, ADL retraining, IADL retraining, Energy Conservation, Equipment eval/education, Cognitive reorientation, Neuro muscular reeducation, Functional transfer training, Fine motor coordination activities, Patient/Family training, UE strengthening/ROM, Therapeutic Activity, Compensatory technique education, Excercise  OT Frequency: minimum 3x/week       OT recs: Recommendation: IP  Rehab  Equipment Recommendations: Defer at this time       SLP recs: Diagnosis: WFL      Signed:    Karie Mainland, MD, PhD  [facility] Pediatric Neurology Resident, PGY-3  07/15/2022

## 2022-07-16 ENCOUNTER — Encounter

## 2022-07-16 ENCOUNTER — Inpatient Hospital Stay: Admit: 2022-07-16 | Payer: Medicare (Managed Care)

## 2022-07-16 LAB — POC GLU MONITORING DEVICE: POC Glucose Monitoring Device: 95 mg/dL (ref 70–100)

## 2022-07-16 MED ORDER — lidocaine (LIDODERM) 5 % 1 patch
5 | Freq: Every day | TOPICAL | PRN
Start: 2022-07-16 — End: 2022-07-22
  Administered 2022-07-17 – 2022-07-19 (×3): 1 via TRANSDERMAL

## 2022-07-16 MED ORDER — sertraline (ZOLOFT) tablet 75 mg
50 | Freq: Every day | ORAL
Start: 2022-07-16 — End: 2022-07-22
  Administered 2022-07-17 – 2022-07-22 (×6): 75 mg via ORAL

## 2022-07-16 MED ORDER — senna-docusate (SENNA-S) 8.6-50 mg per tablet 1 tablet
8.6-50 | Freq: Every evening | ORAL
Start: 2022-07-16 — End: 2022-07-22
  Administered 2022-07-17 – 2022-07-22 (×6): 1 via ORAL

## 2022-07-16 MED ORDER — F-18 FDG 12.33 millicurie
Freq: Once | Status: AC | PRN
Start: 2022-07-16 — End: 2022-07-16
  Administered 2022-07-16: 15:00:00 12.33 via INTRAVENOUS

## 2022-07-16 MED ORDER — ezetimibe (ZETIA) tablet 10 mg
10 | Freq: Every evening | ORAL
Start: 2022-07-16 — End: 2022-07-22
  Administered 2022-07-17 – 2022-07-22 (×6): 10 mg via ORAL

## 2022-07-16 MED ORDER — lidocaine (LIDODERM) 5 % 1 patch
5 | Freq: Every day | TOPICAL | PRN
Start: 2022-07-16 — End: 2022-07-16
  Administered 2022-07-16: 21:00:00 1 via TRANSDERMAL

## 2022-07-16 MED ORDER — polyethylene glycol (MIRALAX) packet 17 g
17 | Freq: Two times a day (BID) | ORAL
Start: 2022-07-16 — End: 2022-07-22
  Administered 2022-07-17 – 2022-07-22 (×11): 17 g via ORAL

## 2022-07-16 MED FILL — VITAMIN C 500 MG TABLET: 500 500 MG | ORAL | Qty: 1

## 2022-07-16 MED FILL — TYLENOL 325 MG TABLET: 325 325 mg | ORAL | Qty: 2

## 2022-07-16 MED FILL — NICOTINE 14 MG/24 HR DAILY TRANSDERMAL PATCH: 14 14 mg/24 hr | TRANSDERMAL | Qty: 1

## 2022-07-16 MED FILL — LISINOPRIL 10 MG TABLET: 10 10 MG | ORAL | Qty: 2

## 2022-07-16 MED FILL — ASPIRIN 81 MG CHEWABLE TABLET: 81 81 MG | ORAL | Qty: 1

## 2022-07-16 MED FILL — HEPARIN (PORCINE) 5,000 UNIT/ML INJECTION SOLUTION: 5000 5,000 unit/mL | INTRAMUSCULAR | Qty: 1

## 2022-07-16 MED FILL — GABAPENTIN 100 MG CAPSULE: 100 100 MG | ORAL | Qty: 2

## 2022-07-16 MED FILL — ATORVASTATIN 80 MG TABLET: 80 80 MG | ORAL | Qty: 1

## 2022-07-16 MED FILL — POLYETHYLENE GLYCOL 3350 17 GRAM ORAL POWDER PACKET: 17 17 gram | ORAL | Qty: 1

## 2022-07-16 MED FILL — METOLAZONE 5 MG TABLET: 5 5 MG | ORAL | Qty: 1

## 2022-07-16 MED FILL — SERTRALINE 50 MG TABLET: 50 50 MG | ORAL | Qty: 1

## 2022-07-16 MED FILL — PANTOPRAZOLE 40 MG TABLET,DELAYED RELEASE: 40 40 MG | ORAL | Qty: 1

## 2022-07-16 MED FILL — BISACODYL 10 MG RECTAL SUPPOSITORY: 10 10 mg | RECTAL | Qty: 1

## 2022-07-16 MED FILL — F-18 FDG INJECTION: 12.33 12.33 millicurie | Qty: 1

## 2022-07-16 NOTE — Progress Notes (Signed)
Occupational Therapy  Treatment     Name: Michele Hopkins  DOB: 10-08-1945  Attending Physician: Marchelle Folks, MD  Admission Diagnosis: possible stroke  Date: 07/16/2022  Room: 1610/R6045  Reviewed Pertinent hospital course: Yes   Hospital Course PT/OT: 77 y/o F p/w R sided weakness. OSH CT head: several new small low-density areas in brainstem, acute/subacute infarcts vs artifact. 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), CAD s/p PCI to LAD (2005), PAD, COPD, tobacco use, HLD, HTN, and CKD stage 3  Precautions: none  Activity Level: Activity as tolerated    Assist: None      Recommendation  Recommendation: IP Rehab  Equipment Recommendations: Defer at this time      Assessment  Assessment: Decreased IADLs, Decreased Functional Mobility, Decreased activity tolerance, Decreased ADL status, Decreased Balance, Decreased self-care transfers, Decreased fine motor control, Decreased UE strength          Prognosis for OT goals: Good                        Pt is cooperative with OT. Pt ambulated within room with Min A using single point cane or CGA using RW. Pt completed toileting and LB dressing with Min A. Pt completed grooming at sink with SBA. Pt presents with deficits in ADL performance, functional mobility, activity tolerance, and global strength. Pt will continue to benefit from skilled OT services to address these deficits.       Outcome Measures  AM-PAC 6 Clicks Daily Activity Inpatient Short Form: OT 6 Clicks Score: 17     Cognition  Overall Cognitive Status: Within Functional Limits  Arousal/Alertness: Alert  Orientation Level: Oriented X4  Behavior: Appropriate;Cooperative  Following Commands: Follows all commands and directions without difficulty  Safety Judgment: Good awareness of safety precautions  Insight: Demonstrated intact insight into limitation and abilities to complete ADL's safely    Pain  Pain Score: 9   Pain Location: Generalized  Pain  Descriptors: Aching  Pain Intervention(s): Ambulation/increased activity;Therapeutic presence;Repositioned  Therapist reported pain to: RN notified    Exercises       Functional Mobility  Bed Mobility  Supine to Sit: Stand-by assistance;head of bed elevated  Functional Transfers  Sit to Stand: Minimal assistance;up to assistive device  Sit to Stand Assistive Device: Single-point cane  Stand to Sit: Minimal assistance;with assistive device  Stand to Sit Assistive Device: Single-point cane  Bed to Chair: Minimal assistance;with assistive device  Bed to Chair Assistive Device: Single-point cane  Toilet Transfers: Minimal assistance;with assistive device;grab bar;increased time to complete task  Toilet Transfers  Assistive Device: Single-point cane  Functional Mobility: Minimal assistance;with assistive device;Contact guard assistance  Functional Mobility Assistive Device: Single-point cane  Functional Mobility Comment: Pt ambulated a household distance in room with min A and use of SPC; pt intermittently using therapist hand for support or reaching out for objects in environment of external support. Pt able to ambulate with CGA using RW.  Balance  Sitting - Static: Supervision  Sitting - Dynamic: Stand by assistance  Standing - Static: Contact Guard Assistance;With Assistive Device   Standing-Static Assistive Device: Rolling walker  Standing - DynamicContractor;With Assistive Device;Minimal Assistance (Min A with cane)  Standing-Dynamic Assistive Device: Rolling walker    Gait belt used: Yes    ADL  Grooming: Stand-by assistance  Grooming Deficit: Set-up;Supervision/safety  Location Assessed Grooming: Standing at sink  Grooming  Deficit Additional Comments: Pt brushed hair and washed hands  Lower Body Dressing: Minimal assistance  Lower Body Dressing Deficit: Don/doff brief  Location Assessed LE Dressing: Toilet  Lower Body Dressing Deficit Additional Comments: assistance for managing brief up after  toileting  Toileting: Minimal assistance  Toileting Deficit: Clothing management up  Location Assessed Toileting: Toilet       Position after Treatment/Safety Handoff  Position after therapy session: Chair  Details: RN notified;Call light/ needs within reach  Alarms: Chair  Alarms Status: Activated and Interfaced with call system    Goals  Goals to be met in: one week  Patient stated goal: to go home  Patient will complete toilet transfer: Contact Guard assistance (goal met and upgraded 4/16)  Patient will complete toileting: Minimal assistance (goal met and upgraded 4/16)  Patient will complete lower body dressing: Will tolerate assessment  Pt Will participate in upper extremity HEP to prep for ADLs: RUE (goal met; continue 4/16)  Miscellaneous Goal #3: Pt will complete STS with CGA and LRAD in prep for OOB ADLs  Miscellaneous Goal #5: Pt will participate in functional cognition assessment  Long Term Goal : Pt will complete bathing assessment  Long term goal to be met in: two weeks  Collaborated with: Patient    Plan  Plan  Treatment Interventions: Activity Tolerance training, ADL retraining, IADL retraining, Energy Conservation, Equipment eval/education, Cognitive reorientation, Neuro muscular reeducation, Functional transfer training, Fine motor coordination activities, Patient/Family training, UE strengthening/ROM, Therapeutic Activity, Compensatory technique education, Excercise  OT Frequency: minimum 3x/week  The plan of care and recommendations assesses the patient's and/or caregiver's readiness, willingness, and ability to provide or support functional mobility and ADL tasks as needed upon discharge.    Patient/Family Education  Educated patient on the role of occupational therapy, OT plan of care, discharge recommendation, ADL training, functional mobility training, and the importance of safety and fall prevention strategies including need for supervision/ assistance with OOB activity. patient  verbalized  understanding.    OT Time  Start Time: 1348  Stop Time: 1418  Time Calculation (min): 30 min    OT Charges     $Therapeutic Activity: 8-22 mins  $Self Care/ADL/Home Management Training: 8-22 mins           Problem List  Patient Active Problem List   Diagnosis    Stroke (CMS-HCC)    COPD (chronic obstructive pulmonary disease) (CMS-HCC)    HTN (hypertension)    HLD (hyperlipidemia)    CKD (chronic kidney disease) stage 3, GFR 30-59 ml/min (CMS-HCC)     Past Medical History  Past Medical History:   Diagnosis Date    CAD (coronary artery disease)     Carotid arterial disease (CMS-HCC)     Chronic pain syndrome     CKD (chronic kidney disease) stage 3, GFR 30-59 ml/min (CMS-HCC)     CVA (cerebral vascular accident) (CMS-HCC)     Depression     Dysphagia     GERD with esophagitis     History of bladder cancer     HLD (hyperlipidemia)     HTN (hypertension)     Idiopathic peripheral neuropathy     PAD (peripheral artery disease) (CMS-HCC)     Solitary pulmonary nodule present on computed tomography of lung     TMJ syndrome     Vitamin D deficiency      Past Surgical History  Past Surgical History:   Procedure Laterality Date    APPENDECTOMY  CHOLECYSTECTOMY      COLONOSCOPY  06/26/2022    CORONARY ANGIOPLASTY WITH STENT PLACEMENT  2005    LAD    ESOPHAGOGASTRODUODENOSCOPY  06/26/2022    HERNIA REPAIR  02/2022    Left inguinal    HYSTERECTOMY      REPLACEMENT TOTAL KNEE Right

## 2022-07-16 NOTE — Plan of Care (Signed)
Problem: Safety  Goal: Patient will be injury free during hospitalization  Description: Assess and monitor vitals signs, neurological status including level of consciousness and orientation. Assess patient's risk for falls and implement fall prevention plan of care and interventions per hospital policy.      Ensure arm band on, uncluttered walking paths in room, adequate room lighting, call light and overbed table within reach, bed in low position, wheels locked, side rails up per policy, and non-skid footwear provided.   Outcome: Progressing  Goal: Patient with weight > 350lbs will have appropriate equipment  Description: Consider ordering Bariatric Bed, Chair and Bedside Commode for patient weight > 350 lbs.  Outcome: Progressing     Problem: Patient will remain free of falls  Goal: Universal Fall Precautions  Outcome: Progressing     Problem: Daily Care  Goal: Daily care needs are met  Description: Assess and monitor ability to perform self care and identify potential discharge needs.  Outcome: Progressing     Problem: Psychosocial Needs  Goal: Demonstrates ability to cope with hospitalization/illness  Description: Assess and monitor patients ability to cope with his/her illness.  Outcome: Progressing     Problem: Discharge Barriers  Goal: Patient's discharge needs are met  Description: Collaborate with interdisciplinary team and initiate plans and interventions as needed.   Outcome: Progressing     Problem: Patient will remain free of injury d/t fall  Goal: High Fall Risk Precautions  Outcome: Progressing  Goal: Additional High Fall Risk Precautions (consider the following)  Outcome: Progressing     Problem: Knowledge Deficit  Goal: Patient/family/caregiver demonstrates understanding of disease process, treatment plan, medications, and discharge instructions  Description: Complete learning assessment and assess knowledge base.  Outcome: Progressing     Problem: Potential for Compromised Skin Integrity  Goal:  Skin integrity is maintained or improved  Description: Assess and monitor skin integrity. Identify patients at risk for skin breakdown on admission and per policy. Collaborate with interdisciplinary team and initiate plans and interventions as needed.  Outcome: Progressing  Goal: Nutritional status is improving  Description: Monitor and assess patient for malnutrition (ex- brittle hair, bruises, dry skin, pale skin and conjunctiva, muscle wasting, smooth red tongue, and disorientation). Collaborate with interdisciplinary team and initiate plan and interventions as ordered.  Monitor patient's weight and dietary intake as ordered or per policy. Utilize nutrition screening tool and intervene per policy. Determine patient's food preferences and provide high-protein, high-caloric foods as appropriate.   Outcome: Progressing     Problem: Incontinence  Goal: Perineal skin integrity is maintained or improved  Description: Assess genitourinary system, perineal skin, labs (urinalysis), and history of incontinence to include past management, aggravating, and alleviating factors.  Collaborate with interdisciplinary team and initiate plans and interventions as needed.  Outcome: Progressing     Problem: Acute Pain  Description: Patient's pain progressing toward patient's stated pain goal  Goal: Patient displays improved well-being such as baseline levels for pulse, BP, respirations and relaxed muscle tone or body posture  Outcome: Progressing  Goal: Patient will manage pain with the appropriate technique/intervention  Description: Assess and monitor patient's pain using appropriate pain scale. Collaborate with interdisciplinary team and initiate plan and interventions as ordered.  Re-assess patient's pain level 30-60 minutes after pain management intervention.  Outcome: Progressing  Goal: Patient will reduce or eliminate use of analgesics  Outcome: Progressing  Goal: Patients pain is managed to allow active participation in daily  activities  Outcome: Progressing  Goal: Patient verbalizes a reduction in  pain level  Outcome: Progressing  Goal: Discharge Pain Management Plan (Acute Pain)  Outcome: Progressing     Problem: Chronic Pain  Description: Patient's pain progressing toward patient's stated pain goal  Goal: Pt will have limited adverse effects related to chronic pain  Description: i.e. Depression, opioid induced constipation, respiratory depression  Outcome: Progressing  Goal: Patient will manage pain with the appropriate technique/intervention  Description: Assess and monitor patient's pain using appropriate pain scale. Collaborate with interdisciplinary team and initiate plan and interventions as ordered.  Re-assess patient's pain level 30-60 minutes after pain management intervention.  Outcome: Progressing  Goal: Patient will reduce or eliminate use of analgesics  Outcome: Progressing  Goal: Patients pain is managed to allow active participation in daily activities  Outcome: Progressing  Goal: Patient verbalizes a reduction in pain level  Outcome: Progressing  Goal: Discharge Pain Management Plan (Chronic Pain)  Outcome: Progressing

## 2022-07-16 NOTE — Care Coordination-Inpatient (Signed)
[place]  Case Management/Social Work Department  Progress Note    Patient Information     Patient Name: Coren Crownover  MRN: [id number]  Hospital day: 5  Inpatient/Observation:  Inpatient   Level of Care:  Neurology  Admit date:  07/11/2022  Admission diagnosis: possible stroke    PMH:  has a past medical history of CAD (coronary artery disease), Carotid arterial disease (CMS-HCC), Chronic pain syndrome, CKD (chronic kidney disease) stage 3, GFR 30-59 ml/min (CMS-HCC), CVA (cerebral vascular accident) (CMS-HCC), Depression, Dysphagia, GERD with esophagitis, History of bladder cancer, HLD (hyperlipidemia), HTN (hypertension), Idiopathic peripheral neuropathy, PAD (peripheral artery disease) (CMS-HCC), Solitary pulmonary nodule present on computed tomography of lung, TMJ syndrome, and Vitamin D deficiency.    PCP:  ATTENDING PROVIDER UNKNOWN    Home Pharmacy:    [facility]  [address]  [place] [address]  Phone: [phone]         Medical Insurance Coverage:  Payor: [phone] MANAGED MEDICARE / Plan: [employer] CHOICE PPO MEDICARE / Product Type: Medicare Mngd Care /     Other Pertinent Information     RN CM completed chart review and attended interdisciplinary rounds with MD and the interdisciplinary team. Per MD patient is  medically ready for discharge.      RN CM went and spoke with patient at bedside to receive IPR choices.  Patient stated her and her son Thereasa Distance wanted [facility] for [facility].    RN CM sent referral to [facility] through [facility] and called and spoke with liaison, Tiara (P: [phone]; F:  [phone]) CM faxed referral to [facility].    Discharge Plan     Anticipated discharge plan:  IPR     Anticipated discharge date:  07/20/22     CM/SW will continue to follow and remain available for discharge planning needs.      Winona Legato, RN     Cell [phone]

## 2022-07-16 NOTE — Progress Notes (Signed)
[facility]  Neurology Department     Fellow Attestation:     I saw and examined the patient on 07/16/22 and discussed the case with the resident/medical student and agree with the assessment and plan as documented in the note below.     Additional details:  Michele Hopkins is a 77 y.o. female with stroke (1980's), CAD, PAD, HTN, HLD, CKD3, COPD, tobacco use disorder, ambulatory with walker at baseline presenting 07/11/2022 for trouble standing, leaning to the right.    CTA with mild atherosclerosis of bilateral carotids. Incidental finding of  MRI brain with 2 punctate foci of acute stroke in L basal ganglia. TTE w/bubble EF 55-60%, no RWMA, no right-to-left shunt, no thrombus, L atrium normal. LDL 112, A1C 5.2%.    Etiology of strokes may be small vessel - suggestion of subacute and acute with symptoms going on for a couple weeks. Cannot rule out embolic.    PET scan pending to evaluate for lung malignancy. ASA 81mg  daily. Atorvastatin 80mg  daily.    Myrtice Lauth, MD PGY5 Vascular Neurology Fellow     Parts of the attestation above have been copied from previous documentation by me for continuity of care. All parts have been reviewed and updated based on additional history, examination, and data from today to reflect current management of this patient on this date.       [facility]  Department of Neurology and Rehab Medicine  Inpatient Progress Note    Chief Complaint / Brief Hospital Course     Michele Hopkins is a 77 y.o. female with a PMH significant for remote CVA (1980's), CAD s/p PCI to LAD (2005), PAD, COPD, tobacco use, HLD, HTN, and CKD stage 3, who was transferred from [facility] due to concerns for stroke. Had a fall from the side of the bed on 4/8 without LOC; she reported feeling unsteady. After the fall, experienced R-sided weakness involving hand, arm, face. Symptoms began to improve so she did not seek medical evaluation.  On 4/13, she sat down to drink coffee and was unable to stand up nor move the R side of her body, trouble speaking and swallowing. First evaluated at OSH with [facility] revealing several new small low-density areas within the brainstem. CTA neck dense atheromatous calcification bilateral carotid bulbs and right proximal ICA (less than 50% reduction of origin of the right ICA). MRI with two small acute infarcts in the L basal ganglia. Incidental lung nodule further assessed on CT Chest with concern for malignancy.    Today is Hospital Day 5.    Interval History / Subjective     No acute events overnight, no new neurologic concerns. SBP 100s-114. NPO for PET scan this morning.    Review of Systems (focused)     Constitutional: Negative for fever.   Respiratory: Negative for cough and shortness of breath.  Cardiovascular: Negative for chest pain, palpitations  Gastrointestinal: Negative for abdominal pain, N/V, diarrhea and constipation.  Neurological: Negative for new numbness, tingling, or focal weakness    Medications   I personally reviewed all scheduled and PRN medications. All allergies were reviewed and updated.  See MAR for additional details.    Scheduled Medications:   ascorbic acid (vitamin C)  500 mg Oral Daily 0900    aspirin  81 mg Oral Daily with breakfast    atorvastatin  80 mg Oral Nightly (2100)    budesonide-formoteroL  2 puff Inhalation RT BID  cyanocobalamin  1,000 mcg Oral Daily 0900    ergocalciferol  50,000 Units Oral Weekly    gabapentin  200 mg Oral BID    heparin  5,000 Units Subcutaneous 3 times per day    lisinopriL  20 mg Oral Daily 0900    metOLazone  5 mg Oral Daily 0900    nicotine  1 patch Transdermal Daily 0900    pantoprazole  40 mg Oral QAM AC    polyethylene glycol  17 g Oral Daily 0900    sertraline  25 mg Oral Daily 0900       Current Facility-Administered Medications   Medication Dose Frequency Provider Last Admin    acetaminophen  650 mg Q4H PRN Chanetta Marshall, MD 650 mg at  07/15/22 1610    Or    acetaminophen  650 mg Q4H PRN Chanetta Marshall, MD      albuterol  2.5 mg RT Q4H PRN Chanetta Marshall, MD      ascorbic acid (vitamin C)  500 mg Daily 0900 Chanetta Marshall, MD 500 mg at 07/15/22 9604    aspirin  81 mg Daily with breakfast Chanetta Marshall, MD 81 mg at 07/15/22 5409    atorvastatin  80 mg Nightly (2100) Chanetta Marshall, MD 80 mg at 07/15/22 2056    bisacodyL  10 mg Daily PRN Karie Mainland, MD      budesonide-formoteroL  2 puff RT BID Chanetta Marshall, MD 2 puff at 07/14/22 2018    cyanocobalamin  1,000 mcg Daily 0900 Chanetta Marshall, MD 1,000 mcg at 07/15/22 8119    ED albuterol  2 puff RT Q4H PRN Chanetta Marshall, MD      ergocalciferol  50,000 Units Weekly Chanetta Marshall, MD 50,000 Units at 07/12/22 1119    gabapentin  200 mg BID Chanetta Marshall, MD 200 mg at 07/15/22 2056    guaiFENesin  600 mg BID PRN Ruthe Mannan, MD 600 mg at 07/13/22 1606    heparin  5,000 Units 3 times per day Chanetta Marshall, MD 5,000 Units at 07/16/22 0517    lisinopriL  20 mg Daily 0900 Chanetta Marshall, MD 20 mg at 07/15/22 1478    metOLazone  5 mg Daily 0900 Chanetta Marshall, MD 5 mg at 07/15/22 2956    nicotine  1 patch Daily 0900 Chanetta Marshall, MD 1 patch at 07/15/22 2130    And    nicotine (polacrilex)  2 mg Q1H PRN Chanetta Marshall, MD 2 mg at 07/13/22 0834    ondansetron  4 mg Q6H PRN Chanetta Marshall, MD      pantoprazole  40 mg QAM AC Chanetta Marshall, MD 40 mg at 07/15/22 0548    polyethylene glycol  17 g Daily 0900 Karie Mainland, MD 17 g at 07/15/22 1629    sertraline  25 mg Daily 0900 Chanetta Marshall, MD 25 mg at 07/15/22 8657       Vital Signs and Intake/Output   All vital signs during the previous 24 hours reviewed.     Temp:  [97.4 F (36.3 C)-98.6 F (37 C)] 97.7 F (36.5 C)  Heart Rate:  [64-71] 65  Resp:  [16-18] 16  BP: (100-114)/(53-62) 100/59  FiO2:  [21 %] 21 %      Intake/Output Summary (Last 24 hours) at 07/16/2022 0755  Last data filed at 07/15/2022 1917  Gross per 24 hour    Intake --   Output 1250 ml   Net -1250 ml       Physical Exam  General Medical Exam  CONSTITUTIONAL: well-appearing adult women in no acute distress, appears stated age  EYES: no conjunctival injection or scleral icterus  CV: warm and well perfused, no significant peripheral edema  RESP: no accessory muscle use, no increased WOB on nasal cannula, frequent coughing productive for mucous  SKIN: warm, dry, intact with normal turgor, no appreciable lesions    Neurologic Exam  Mental Status:   Level of consciousness: alerts spontaneously and to voice  Orientation: oriented to person, place, day, situation  Attention/concentration: able to attend exam well  Language: normal fluency/word-finding in conversation, no notable paraphasic errors, able to follow multi-step commands.  Speech: clear, coherent, goal-directed, spontaneous, without notable dysarthria  No left-right confusion. No signs of neglect on exam    Cranial Nerves:  CN II: Visual fields grossly intact, tracks across visual fields.  CN III/IV/VI: Gaze conjugate, EOMI with normal saccades/smooth pursuit, no nystagmus  CN V: Facial sensation to light touch intact in V1, V2; subjectively diminished on R relative to L in V3  CN VII: Facial motion and strength intact and symmetric   CN VIII: Hearing grossly intact to conversation  CN IX/X: Strong phonation, cough  CN XI: Sternocleidomastoid strong bilaterally  CN XII: Tongue protrudes midline    Motor:   Decreased bulk appropriate to age.  No rest tremor or adventitial movements observed.    No pronator drift.     Strength: Raises and holds antigravity for all four extremities, but difficulty raising RLE to 45 degrees and drifts down    Coordination:  No dysmetria on finger-to-nose. Able to perform finger tapping.    Reflexes: Not tested today    Sensory: Reports diminished sensation on RUE/RLE relative to the L    Gait and Station:  Station: normal truncal control, rises from bed without assist.  Gait: normal  gait and base with standard walk.      Diagnostic Studies   All diagnostic studies over the past 24 hours were reviewed by me and the attending. Pertinent findings discussed below in the A&P.    Labs:        Lab 07/12/22  0054   SODIUM 139   POTASSIUM 3.0*   CHLORIDE 98   CO2 31   BUN 15   CREATININE 0.75   GLUCOSE 111*         Lab 07/12/22  0054   WBC 5.7   HEMOGLOBIN 13.6   HEMATOCRIT 39.3   MEAN CORPUSCULAR VOLUME 85.0   PLATELETS 214       Radiology:    [facility] and CTA performed at OSH    MRI Brain (07/13/2022)    IMPRESSION:   1.  2 punctate foci of acute ischemia in the left basal ganglia measuring less than 1 mm each.       CT Chest (07/14/2022)  IMPRESSION:     Irregular spiculated nodule in the right upper lobe, indeterminate but suspicious for primary malignancy. CT PET and/or tissue sampling may be considered. At minimum, comparison with any available outside imaging and close follow-up is recommended.     Limited number of additional smaller nodules are nonspecific and can be reassessed on follow-up.     Bibasilar areas of dependent consolidation associated with retained airway secretions, likely atelectasis or scar. No specific findings to suggest superimposed pneumonia.       Assessment and Plan     Kathlene Yano is a 77 y.o. 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 Today is Hospital Day 5.     I note the following medical issues:    #Acute L Basal Ganglia Ischemic Stroke  #Chronic Small Vessel Disease  #Bilateral Carotid bulb atherosclerosis, R ICA stenosis  Presented with R-sided hemiparesis and sensory loss. Reportedly had an NIHSS1 on arrival to OSH; [place] on arrival to [facility]. OSH CTH notable for several new, small, low-density areas within the pons / brainstem that could be acute or subacute infarcts vs artifact. Patient was outside window for TNK (LKW was 4/7). MRI  significant for two punctate foci of acute ischemia in the left basal ganglia (did not clearly show brainstem infarct). Potential etiologies include cardioembolic vs thromboembolic vs large vessel atherosclerosis vs chronic microvascular disease vs cryptogenic. Risk stratification labs: A1c 5.2%, LDL 112. TTE showed no shunt or vegetation. Deep nuclei location is consistent with chronic small vessel disease, likely related to poorly controlled HTN and significant smoking history.   - Continue [facility] tele  - Neurochecks Q4h  - Secondary stroke prevention: ASA 81 mg daily, Atorvastatin 80 mg QHS  - Will not discharge with an event monitor  - PT/OT/SLP - recommend IPR  - Stroke Nurse consult    #CAD s/p PCI to LAD  #HTN  Patient instructed to take daily ASA 81 mg, but frequently forgets and not listed in home medications. ECG from OSH normal sinus rhythm without ischemic changes. Troponin in normal range. TTE: EF 55-60%, no LVH, no RWMA, no identified thrombus, no shunt  - Continue ASA and Atorvastatin  - Continue home Lisinopril 20 mg daily, Metolazone 5 mg daily    #COPD  #Tobacco Use Disorder  #Incidental RUL pulmonary nodule  Home regimen includes Albuterol inhaler as-needed and Advair 1 puff BID. Does not use oxygen at baseline. CT neck from OSH noted incidental 2.2 cm pulmonary nodule in the right apex of the lung. Further investigated with CT Chest (4/16) that is concerning - irregular spiculated nodule in the right upper lobe, indeterminate but suspicious for primary malignancy.  - Monitor WOB, O2 requirement  - Continue home inhalers  - Nicotine gum PRN  - Consider smoking cessation counseling  - PET scan scheduled this morning  - Will need outpatient oncology follow-up    #HLD  Lipid Profile: Triglyc 122, HDL 46, LDL 112, T-Chol 182,   - Continue home Atorvastatin    #CKD Stage III  Baseline Cr unknown due to lack of medical records. However, documented CKD stage 3 from OSH records. Cr 0.75, BUN 15 on admission.    - Avoid nephrotoxic medications    #GERD  - Continue home Pantoprazole    #Neuropsych  - Continue home Sertraline, Gabapentin      Inpatient Checklist:    Diet: Regular Diet once no longer NPO  Diet/Nutrition Orders    Diet NPO past midnight     Frequency: Effective _____     Number of Occurrences: Until Specified     IV Fluids: None  Lines: PIV  Foley: No  Telemetry: Yes - stroke  Pain management: Acetaminophen PRN  DVT prophylaxis: SQH  GI prophylaxis: PPI  Bowel Regimen: Suppository PRN  Precautions/Isolation: None  Ambulation: as tolerated  Ancillary services: PT/OT/SLP/SW  Code Status: Full Code  Dispo: Floor         PT Intervention Plan: Treatment/Interventions: LE strengthening/ROM, Therapeutic Activity, Therapeutic Exercise, Endurance training, Patient/family  training, Equipment eval/education, Investment banker, operational, Press photographer, Stair Training  PT Frequency: minimum 3x/week       PT recs: Recommendation: IP Rehab  Equipment Recommended: Defer to facility to obtain, Rolling walker       OT Interventions Plan: Treatment Interventions: Activity Tolerance training, ADL retraining, IADL retraining, Energy Conservation, Equipment eval/education, Cognitive reorientation, Neuro muscular reeducation, Functional transfer training, Fine motor coordination activities, Patient/Family training, UE strengthening/ROM, Therapeutic Activity, Compensatory technique education, Excercise  OT Frequency: minimum 3x/week       OT recs: Recommendation: IP Rehab  Equipment Recommendations: Defer at this time       SLP recs: Diagnosis: WFL      Signed:    Karie Mainland, MD, PhD  [facility] Pediatric Neurology Resident, PGY-3      I saw and examined the patient on 07/16/22, and discussed the case with the Dr. Lucretia Field and Dr. Ladona Ridgel and agree with the  findings and plan as documented in their note.   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  She is a smoker.  Will discuss with patient tomorrow and will  need to consult oncology.    Cherrie Distance, MD

## 2022-07-16 NOTE — Progress Notes (Signed)
Physical Therapy   Reason Patient Not Seen     Name: Michele Hopkins  DOB: Aug 29, 1945  Attending Physician: Marchelle Folks, MD  Admission Diagnosis: possible stroke  Date: 07/16/2022  Precautions: Precautions: none  Reviewed Pertinent hospital course: Yes    Unable to see patient due to: Pt off unit for PET scan.  Will follow up with therapy services as medically appropriate and as schedule permits.     Time Attempted: 1112

## 2022-07-17 LAB — CBC
Hematocrit: 36.9 % (ref 35.0–45.0)
Hemoglobin: 12.5 g/dL (ref 11.7–15.5)
MCH: 29.6 pg (ref 27.0–33.0)
MCHC: 33.8 g/dL (ref 32.0–36.0)
MCV: 87.5 fL (ref 80.0–100.0)
MPV: 8.5 fL (ref 7.5–11.5)
Platelets: 175 10*3/uL (ref 140–400)
RBC: 4.21 10*6/uL (ref 3.80–5.10)
RDW: 13.9 % (ref 11.0–15.0)
WBC: 4.2 10*3/uL (ref 3.8–10.8)

## 2022-07-17 LAB — BASIC METABOLIC PANEL
Anion Gap: 7 mmol/L (ref 3–16)
BUN: 30 mg/dL — ABNORMAL HIGH (ref 7–25)
CO2: 29 mmol/L (ref 21–33)
Calcium: 9.2 mg/dL (ref 8.6–10.3)
Chloride: 101 mmol/L (ref 98–110)
Creatinine: 0.96 mg/dL (ref 0.60–1.30)
EGFR: 61
Glucose: 103 mg/dL — ABNORMAL HIGH (ref 70–100)
Osmolality, Calculated: 290 mOsm/kg (ref 278–305)
Potassium: 4 mmol/L (ref 3.5–5.3)
Sodium: 137 mmol/L (ref 133–146)

## 2022-07-17 LAB — PHOSPHORUS: Phosphorus: 3.1 mg/dL (ref 2.1–4.5)

## 2022-07-17 LAB — MAGNESIUM: Magnesium: 1.9 mg/dL (ref 1.5–2.5)

## 2022-07-17 MED FILL — VITAMIN C 500 MG TABLET: 500 500 MG | ORAL | Qty: 1

## 2022-07-17 MED FILL — VITAMIN B-12  1,000 MCG TABLET: 1000 1000 MCG | ORAL | Qty: 1

## 2022-07-17 MED FILL — HEPARIN (PORCINE) 5,000 UNIT/ML INJECTION SOLUTION: 5000 5,000 unit/mL | INTRAMUSCULAR | Qty: 1

## 2022-07-17 MED FILL — POLYETHYLENE GLYCOL 3350 17 GRAM ORAL POWDER PACKET: 17 17 gram | ORAL | Qty: 1

## 2022-07-17 MED FILL — LISINOPRIL 10 MG TABLET: 10 10 MG | ORAL | Qty: 2

## 2022-07-17 MED FILL — ATORVASTATIN 80 MG TABLET: 80 80 MG | ORAL | Qty: 1

## 2022-07-17 MED FILL — METOLAZONE 5 MG TABLET: 5 5 MG | ORAL | Qty: 1

## 2022-07-17 MED FILL — GABAPENTIN 100 MG CAPSULE: 100 100 MG | ORAL | Qty: 2

## 2022-07-17 MED FILL — SERTRALINE 50 MG TABLET: 50 50 MG | ORAL | Qty: 2

## 2022-07-17 MED FILL — EZETIMIBE 10 MG TABLET: 10 10 mg | ORAL | Qty: 1

## 2022-07-17 MED FILL — LIDODERM 5 % TOPICAL PATCH: 5 5 % | TOPICAL | Qty: 1

## 2022-07-17 MED FILL — TYLENOL 325 MG TABLET: 325 325 mg | ORAL | Qty: 2

## 2022-07-17 MED FILL — PANTOPRAZOLE 40 MG TABLET,DELAYED RELEASE: 40 40 MG | ORAL | Qty: 1

## 2022-07-17 MED FILL — ASPIRIN 81 MG CHEWABLE TABLET: 81 81 MG | ORAL | Qty: 1

## 2022-07-17 MED FILL — STIMULANT LAXATIVE PLUS 8.6 MG-50 MG TABLET: 8.6-50 8.6-50 mg | ORAL | Qty: 1

## 2022-07-17 MED FILL — NICOTINE 14 MG/24 HR DAILY TRANSDERMAL PATCH: 14 14 mg/24 hr | TRANSDERMAL | Qty: 1

## 2022-07-17 NOTE — Care Coordination-Inpatient (Signed)
  Case Management/Social Work Department  Progress Note    Patient Information     Patient Name: Hailea Eaglin  MRN: [id number]  Hospital day: 6  Inpatient/Observation:  Inpatient   Level of Care:  fLOOR  Admit date:  07/11/2022  Admission diagnosis: possible stroke    PMH:  has a past medical history of CAD (coronary artery disease), Carotid arterial disease (CMS-HCC), Chronic pain syndrome, CKD (chronic kidney disease) stage 3, GFR 30-59 ml/min (CMS-HCC), CVA (cerebral vascular accident) (CMS-HCC), Depression, Dysphagia, GERD with esophagitis, History of bladder cancer, HLD (hyperlipidemia), HTN (hypertension), Idiopathic peripheral neuropathy, PAD (peripheral artery disease) (CMS-HCC), Solitary pulmonary nodule present on computed tomography of lung, TMJ syndrome, and Vitamin D deficiency.    PCP:  ATTENDING PROVIDER UNKNOWN    Home Pharmacy:    [facility]  [address]  [place] [address]  Phone: [phone]         Medical Insurance Coverage:  Payor: [phone] MANAGED MEDICARE / Plan: [employer] CHOICE PPO MEDICARE / Product Type: Medicare Mngd Care /     Other Pertinent Information     SW received an update from interdisciplinary team. Patient is medically ready for discharge and pending facility acceptance.     Discharge Plan     Anticipated discharge plan:  IPR     Anticipated discharge date:  4/24     CM/SW will continue to follow and remain available for discharge planning needs.      Candee Furbish, MSW, LSW  [phone]

## 2022-07-17 NOTE — Plan of Care (Signed)
Problem: Safety  Goal: Patient will be injury free during hospitalization  Description: Assess and monitor vitals signs, neurological status including level of consciousness and orientation. Assess patient's risk for falls and implement fall prevention plan of care and interventions per hospital policy.      Ensure arm band on, uncluttered walking paths in room, adequate room lighting, call light and overbed table within reach, bed in low position, wheels locked, side rails up per policy, and non-skid footwear provided.   07/17/2022 1624 by Sabino Niemann, RN  Outcome: Progressing  07/17/2022 1208 by Sabino Niemann, RN  Outcome: Progressing  Goal: Patient with weight > 350lbs will have appropriate equipment  Description: Consider ordering Bariatric Bed, Chair and Bedside Commode for patient weight > 350 lbs.  07/17/2022 1624 by Sabino Niemann, RN  Outcome: Progressing  07/17/2022 1208 by Sabino Niemann, RN  Outcome: Progressing     Problem: Patient will remain free of falls  Goal: Universal Fall Precautions  07/17/2022 1624 by Sabino Niemann, RN  Outcome: Progressing  07/17/2022 1208 by Sabino Niemann, RN  Outcome: Progressing

## 2022-07-17 NOTE — Plan of Care (Signed)
Problem: Inadequate Gas Exchange  Goal: Patient is adequately oxygenated and ventilation is improved  Description: Assess and monitor vital signs, oxygen saturation, respiratory status to include rate, depth, effort, and lung sounds, mental status, cyanosis, and labs (ABG's).  Monitor effects of medications that may sedate the patient.  Collaborate with respiratory therapy to administer medications and treatments.  Outcome: Progressing

## 2022-07-17 NOTE — Consults (Signed)
Hematology Oncology Consult    July 17, 2022      Maurya Nethery  Code Status:Full Code   77 y.o.,female,11-Dec-1945    Location:[facility] MRN: [id number]]    Admitting Physician: Ebony Hail   Date of  Admission: 07/11/2022     LOS: 6 days     Reason for Consult:  Lung Mass.    History     Lanasia Porras 77 y.o. Other female 59 Pack Year smoking history, CAD, CVA, ?  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     Pt reports she has lost 30lb in past 2 years and 20lb in 6 months. Lives with friend and her daughter and is able to walk with walker, she is ADL independent and mentions able to do grocery and cooking.     Reports hx of Bladder cancer which was resected with cystoscope initially in [place] and next time [facility] [place] was ablated and was told will need intravesical tx if it reoccurs thus I think it was Non Muscle invasive.     Family hx of breast cancer in mother and H&N in brother.     Currently have L Sided pleuritic chest pain says has been hurting since fall that happened prior to admission.     Past Medical History:   Diagnosis Date    CAD (coronary artery disease)     Carotid arterial disease (CMS-HCC)     Chronic pain syndrome     CKD (chronic kidney disease) stage 3, GFR 30-59 ml/min (CMS-HCC)     CVA (cerebral vascular accident) (CMS-HCC)     Depression     Dysphagia     GERD with esophagitis     History of bladder cancer     HLD (hyperlipidemia)     HTN (hypertension)     Idiopathic peripheral neuropathy     PAD (peripheral artery disease) (CMS-HCC)     Solitary pulmonary nodule present on computed tomography of lung     TMJ syndrome     Vitamin D deficiency       Past Surgical History:   Procedure Laterality Date    APPENDECTOMY      CHOLECYSTECTOMY      COLONOSCOPY  06/26/2022    CORONARY ANGIOPLASTY WITH STENT PLACEMENT  2005    LAD    ESOPHAGOGASTRODUODENOSCOPY  06/26/2022    HERNIA REPAIR  02/2022     Left inguinal    HYSTERECTOMY      REPLACEMENT TOTAL KNEE Right      Allergies   Allergen Reactions    Ana-Kit Bee Sting [Epinephrine-Chlorpheniramine] Anaphylaxis    Sulfa (Sulfonamide Antibiotics) Swelling    Adhesive Tape-Silicones Other (See Comments)     Skin comes off     Family History   Problem Relation Age of Onset    Breast Cancer Mother     Heart failure Mother     Diabetes Mother     Stroke Mother      Social History     Tobacco Use    Smoking status: Every Day     Packs/day: 1.00     Years: 20.00     Additional pack years: 0.00     Total pack years: 20.00     Types: Cigarettes     Start date: 09/12/1997    Smokeless tobacco: Never   Substance Use Topics    Alcohol use: Never  Review of Systems - Oncology    REVIEW OF SYSTEMS:  Gen: No weight loss, no fevers/chills/night sweats  HEENT: No vision changes, headaches    No hearing loss, tinnitus, ear fullness, drainage  CP: No cp/palpitations/chest tightness  Resp: No sob/cough, hemoptysis   GI: No nausea/vomiting/abd pain/diarrhea or loose stools/no constipation  GU: No dysuria, hematuria  Skin: No rashes or skin lesions  Msk: No back pain, neck pain, myaglias/arthalgias   Heme: No new easy bruising or bleeding, no LAD  Pysch: No depression, anxiety, No SI/HI  Neuro: No seizure/stroke, sensory deficits, weakness, LOC/fall      Objective     Vital Signs:  VS: (Range) Last Reading  Temp:  [98.1 F (36.7 C)-99 F (37.2 C)] 98.1 F (36.7 C)  Heart Rate:  [62-73] 65  Resp:  [15-18] 16  BP: (100-117)/(45-72) 100/45  Intake and Output:    Intake/Output Summary (Last 24 hours) at 07/17/2022 1811  Last data filed at 07/17/2022 0849  Gross per 24 hour   Intake 140 ml   Output --   Net 140 ml       Physical Exam  General appearance:   Appears older than/stated age; In no apparent distress  Head: Normocephalic; Atraumatic   Eyes: PERRLA; Conjunctivae without injection; Anicteric sclera; No discharge  Oropharynx: Mucous membranes moist; Oropharynx clear without  lesions, exudate, or petechiae; dentition WNL  Neck: Neck supple; + B/L LN (L Cervical node slighly above Clavicle felt.)  ; No JVD; No thyromegaly  Lungs: L Sided chest pain.   Heart: +S1/S2; Regular rate; No murmurs; No rubs; No gallops  Abdomen:  Soft; Non-tender; Non-distended; +BS; No hepatosplenomegaly; No rebound; No palpable fluid wave  Extremities: No edema, No lesions; D.P and radial pulses +2 B/L  Skin: Cool and dry; No erythema; No ecchymoses, No lesions      Labs:        Lab 07/17/22  0614 07/12/22  0054   WBC 4.2 5.7   HEMOGLOBIN 12.5 13.6   HEMATOCRIT 36.9 39.3   MEAN CORPUSCULAR VOLUME 87.5 85.0   PLATELETS 175 214         Lab 07/17/22  0614 07/12/22  0054   SODIUM 137 139   POTASSIUM 4.0 3.0*   CHLORIDE 101 98   CO2 29 31   BUN 30* 15   CREATININE 0.96 0.75   GLUCOSE 103* 111*         Lab 07/17/22  0614 07/12/22  0054   CALCIUM 9.2 9.1   MAGNESIUM 1.9  --    PHOSPHORUS 3.1  --              Lab 07/12/22  0054   ALT 10   AST 13   ALK PHOS 103   BILIRUBIN TOTAL 0.3   ALBUMIN 3.8     Lab Results   Component Value Date    MG 1.9 07/17/2022     No results found for: URICACID  No results found for: LDH  No results found for: INR      SCHEDULED MEDS   ascorbic acid (vitamin C)  500 mg Oral Daily 0900    aspirin  81 mg Oral Daily with breakfast    atorvastatin  80 mg Oral Nightly (2100)    budesonide-formoteroL  2 puff Inhalation RT BID    cyanocobalamin  1,000 mcg Oral Daily 0900    ergocalciferol  50,000 Units Oral Weekly    ezetimibe  10 mg Oral Nightly (  2100)    gabapentin  200 mg Oral BID    heparin  5,000 Units Subcutaneous 3 times per day    lisinopriL  20 mg Oral Daily 0900    metOLazone  5 mg Oral Daily 0900    nicotine  1 patch Transdermal Daily 0900    pantoprazole  40 mg Oral QAM AC    polyethylene glycol  17 g Oral BID    senna-docusate  1 tablet Oral Nightly (2100)    sertraline  75 mg Oral Daily 0900       CONTINUOUS INFUSIONS      Prn Meds:  acetaminophen **OR** acetaminophen,  albuterol, bisacodyL, ED albuterol, guaiFENesin, lidocaine, nicotine **AND** nicotine (polacrilex), ondansetron    DIAGNOSTIC IMAGING  NM PET CT imaging Skull to Thigh    Result Date: 07/16/2022  IMPRESSION: 1.  FDG avid cervical and thoracic lymphadenopathy, bilateral lung lesions, skeletal lesions, and soft tissue lesion in the lower abdomen/pelvis are suspicious for metastatic malignancy or less likely lymphoma. 2.  Asymmetric uptake in the right fossa of Rosenmuller is likely inflammatory, but correlation with exam is suggested. 3.  Focal uptake in the right nasopharynx is favored to represent physiologic uptake in the longest colli muscle, but reevaluation on follow-up is recommended. This could alternatively be evaluated with contrast-enhanced CT of the neck, if indicated. 4.  Asymmetric laryngeal uptake may reflect left vocal cord weakness. Report Verified by: Jaye Beagle, MD at 07/16/2022 7:02 PM EDT    CT Chest WO contrast    Result Date: 07/14/2022  IMPRESSION: Irregular spiculated nodule in the right upper lobe, indeterminate but suspicious for primary malignancy. CT PET and/or tissue sampling may be considered. At minimum, comparison with any available outside imaging and close follow-up is recommended. Limited number of additional smaller nodules are nonspecific and can be reassessed on follow-up. Bibasilar areas of dependent consolidation associated with retained airway secretions, likely atelectasis or scar. No specific findings to suggest superimposed pneumonia. Approved by Raynelle Highland, MD on 07/14/2022 4:02 PM EDT I have personally reviewed the images and I agree with this report. Report Verified by: Leretha Pol, MD at 07/14/2022 4:19 PM EDT    MRI Head WO contrast    Result Date: 07/13/2022  IMPRESSION: 1.  2 punctate foci of acute ischemia in the left basal ganglia measuring less than 1 mm each. Report Verified by: Leda Roys, MD at 07/13/2022 11:16 PM EDT     Assessment & Plan     This is a  77 y.o. Other female with 11 Pack Year smoking history, CAD, CVA, ?  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   # 59 YR Smoking hx.     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   - Option would be palliative chemo/IO or combination and if any targets identified will be Oral .   - Pt Will benefit from Interventional pulmonary consult for the reviewing imaging and see if able to get Cervical LN biopsy in patient vs EBUS (which can be done outpatient)   - She [place] away from  [facility] would be better at local oncologist but we can help to establish diagnosis and treatment plan while she is in area (hospital + Inpt rehab)   - we will make an appointment for her to see Dr. Karen Chafe upon discharge.           Thank you for the opportunity to participate in the care of Keagan Anthis. Please page with questions that may arise.     Case discussed with attending physician, Dr. Karen Chafe who is in agreement with plan.      Signed:    Percell Boston, MD   Hematology/Oncology Fellow  07/17/2022, 6:11 PM

## 2022-07-17 NOTE — Progress Notes (Signed)
[facility]  Neurology Department     Fellow Attestation:     Discussed the case with the resident/medical student 07/17/22 and agree with the assessment and plan as documented in the note below.     Additional details:  Michele Hopkins is a 77 y.o. female with stroke (1980's), CAD, PAD, HTN, HLD, CKD3, COPD, tobacco use disorder, ambulatory with walker at baseline presenting 07/11/2022 for trouble standing, leaning to the right.     CTA with mild atherosclerosis of bilateral carotids. Incidental finding of  MRI brain with 2 punctate foci of acute stroke in L basal ganglia. TTE w/bubble EF 55-60%, no RWMA, no right-to-left shunt, no thrombus, L atrium normal. LDL 112, A1C 5.2%. PET scan with metastatic malignancy (unknown primary lesion).     Etiology of strokes may be small vessel - suggestion of subacute and acute with symptoms going on for a couple weeks. Cannot rule out embolic or hypercoagulable with active malignancy.     Oncology consult. Cardiac event monitor at discharge. ASA 81mg  daily. Atorvastatin 80mg  daily. IPR placement pending.     Myrtice Lauth, MD PGY5 Vascular Neurology Fellow     Parts of the attestation above have been copied from previous documentation by me for continuity of care. All parts have been reviewed and updated based on additional history, examination, and data from today to reflect current management of this patient on this date.     [facility]  Department of Neurology and Rehab Medicine  Inpatient Progress Note    Chief Complaint / Brief Hospital Course     Michele Hopkins is a 77 y.o. female with a PMH significant for remote CVA (1980's), CAD s/p PCI to LAD (2005), PAD, COPD, tobacco use, HLD, HTN, and CKD stage 3, who was transferred from [facility] due to concerns for stroke. Had a fall from the side of the bed on 4/8 without LOC; she reported feeling unsteady. After the fall, experienced R-sided  weakness involving hand, arm, face. Symptoms began to improve so she did not seek medical evaluation. On 4/13, she sat down to drink coffee and was unable to stand up nor move the R side of her body, trouble speaking and swallowing. First evaluated at OSH with [facility], [place] revealing several new small low-density areas within the brainstem. CTA neck dense atheromatous calcification bilateral carotid bulbs and right proximal ICA (less than 50% reduction of origin of the right ICA). MRI with two small acute infarcts in the L basal ganglia. Incidental lung nodule further assessed on CT Chest with concern for malignancy.    Today is Hospital Day 6.    Interval History / Subjective     No acute events overnight, no new neurologic concerns. SBP 100-110s. Has not had a bowel movement for several days.    PET scan yesterday concerning for malignancy in the lung with additional nodules of concern in the lymph nodes, lower abdomen/pelvis, and potentially bone lesions.  Of note, she reports a h/o bladder cancer. Details unknown as she had care at an OSH, information not in Care Everywhere (e.g. formal diagnosis, if prior treatment, follow-up monitoring plan)    Review of Systems (focused)     Constitutional: Negative for fever.   Respiratory: Negative for cough and shortness of breath.  Cardiovascular: Negative for chest pain, palpitations  Gastrointestinal: Negative for abdominal pain, N/V, diarrhea and constipation.  Neurological: Negative for new numbness, tingling, or focal weakness    Medications  I personally reviewed all scheduled and PRN medications. All allergies were reviewed and updated.  See MAR for additional details.    Scheduled Medications:   ascorbic acid (vitamin C)  500 mg Oral Daily 0900    aspirin  81 mg Oral Daily with breakfast    atorvastatin  80 mg Oral Nightly (2100)    budesonide-formoteroL  2 puff Inhalation RT BID    cyanocobalamin  1,000 mcg Oral Daily 0900    ergocalciferol  50,000 Units Oral Weekly     ezetimibe  10 mg Oral Nightly (2100)    gabapentin  200 mg Oral BID    heparin  5,000 Units Subcutaneous 3 times per day    lisinopriL  20 mg Oral Daily 0900    metOLazone  5 mg Oral Daily 0900    nicotine  1 patch Transdermal Daily 0900    pantoprazole  40 mg Oral QAM AC    polyethylene glycol  17 g Oral BID    senna-docusate  1 tablet Oral Nightly (2100)    sertraline  75 mg Oral Daily 0900       Current Facility-Administered Medications   Medication Dose Frequency Provider Last Admin    acetaminophen  650 mg Q4H PRN Chanetta Marshall, MD 650 mg at 07/16/22 1353    Or    acetaminophen  650 mg Q4H PRN Chanetta Marshall, MD      albuterol  2.5 mg RT Q4H PRN Chanetta Marshall, MD      ascorbic acid (vitamin C)  500 mg Daily 0900 Chanetta Marshall, MD 500 mg at 07/16/22 1353    aspirin  81 mg Daily with breakfast Chanetta Marshall, MD 81 mg at 07/16/22 1353    atorvastatin  80 mg Nightly (2100) Chanetta Marshall, MD 80 mg at 07/16/22 2021    bisacodyL  10 mg Daily PRN Karie Mainland, MD 10 mg at 07/16/22 1651    budesonide-formoteroL  2 puff RT BID Chanetta Marshall, MD 2 puff at 07/16/22 0827    cyanocobalamin  1,000 mcg Daily 0900 Chanetta Marshall, MD 1,000 mcg at 07/16/22 1330    ED albuterol  2 puff RT Q4H PRN Chanetta Marshall, MD      ergocalciferol  50,000 Units Weekly Chanetta Marshall, MD 50,000 Units at 07/12/22 1119    ezetimibe  10 mg Nightly (2100) Karie Mainland, MD 10 mg at 07/16/22 2021    gabapentin  200 mg BID Chanetta Marshall, MD 200 mg at 07/16/22 2020    guaiFENesin  600 mg BID PRN Ruthe Mannan, MD 600 mg at 07/13/22 1606    heparin  5,000 Units 3 times per day Chanetta Marshall, MD 5,000 Units at 07/17/22 0451    lidocaine  1 patch Daily PRN Karie Mainland, MD      lisinopriL  20 mg Daily 0900 Chanetta Marshall, MD 20 mg at 07/16/22 1353    metOLazone  5 mg Daily 0900 Chanetta Marshall, MD 5 mg at 07/16/22 1353    nicotine  1 patch Daily 0900 Chanetta Marshall, MD 1 patch at 07/16/22 1657    And    nicotine (polacrilex)  2  mg Q1H PRN Chanetta Marshall, MD 2 mg at 07/13/22 0834    ondansetron  4 mg Q6H PRN Chanetta Marshall, MD      pantoprazole  40 mg QAM AC Chanetta Marshall, MD 40 mg at 07/16/22 1353    polyethylene glycol  17 g BID Laddie Aquas, MD 17 g at 07/16/22 2020  senna-docusate  1 tablet Nightly (2100) Laddie Aquas, MD 1 tablet at 07/16/22 2021    sertraline  75 mg Daily 0900 Laddie Aquas, MD         Vital Signs and Intake/Output   All vital signs during the previous 24 hours reviewed.     Temp:  [97.8 F (36.6 C)-99 F (37.2 C)] 99 F (37.2 C)  Heart Rate:  [66-73] 69  Resp:  [15-18] 15  BP: (86-117)/(39-72) 117/72    No intake or output data in the 24 hours ending 07/17/22 0757      Physical Exam     General Medical Exam  CONSTITUTIONAL: well-appearing adult women in no acute distress, appears stated age  EYES: no conjunctival injection or scleral icterus  CV: warm and well perfused, no significant peripheral edema  RESP: no accessory muscle use, no increased WOB on nasal cannula, frequent coughing productive for mucous  SKIN: warm, dry, intact with normal turgor, no appreciable lesions    Neurologic Exam  Mental Status:   Level of consciousness: alerts spontaneously and to voice  Orientation: oriented to person, place, day, situation  Attention/concentration: able to attend exam well  Language: normal fluency/word-finding in conversation, no notable paraphasic errors, able to follow multi-step commands.  Speech: clear, coherent, goal-directed, spontaneous, without notable dysarthria  No left-right confusion. No signs of neglect on exam    Cranial Nerves:  CN II: Visual fields grossly intact, tracks across visual fields.  CN III/IV/VI: Gaze conjugate, EOMI with normal saccades/smooth pursuit, no nystagmus  CN V: Facial sensation to light touch intact in V1, V2; subjectively diminished on R relative to L in V3  CN VII: Facial motion and strength intact and symmetric   CN VIII: Hearing grossly intact to  conversation  CN IX/X: Strong phonation, cough  CN XI: Sternocleidomastoid strong bilaterally  CN XII: Tongue protrudes midline    Motor:   Decreased bulk appropriate to age.  No rest tremor or adventitial movements observed.    No pronator drift.     Strength: Raises and holds antigravity for all four extremities, but difficulty raising RLE to 45 degrees and drifts down    Coordination:  No dysmetria on finger-to-nose. Able to perform finger tapping.    Reflexes: Not tested today    Sensory: Reports diminished sensation on RUE/RLE relative to the L    Gait and Station:  Station: normal truncal control, rises from bed without assist.  Gait: normal gait and base with standard walk.      Diagnostic Studies   All diagnostic studies over the past 24 hours were reviewed by me and the attending. Pertinent findings discussed below in the A&P.    Labs:        Lab 07/17/22  0614 07/12/22  0054   SODIUM 137 139   POTASSIUM 4.0 3.0*   CHLORIDE 101 98   CO2 29 31   BUN 30* 15   CREATININE 0.96 0.75   GLUCOSE 103* 111*   MAGNESIUM 1.9  --    PHOSPHORUS 3.1  --          Lab 07/17/22  0614 07/12/22  0054   WBC 4.2 5.7   HEMOGLOBIN 12.5 13.6   HEMATOCRIT 36.9 39.3   MEAN CORPUSCULAR VOLUME 87.5 85.0   PLATELETS 175 214       Radiology:    [facility] and CTA performed at OSH    MRI Brain (07/13/2022)    IMPRESSION:   1.  2 punctate foci of acute ischemia in the left basal ganglia measuring less than 1 mm each.       CT Chest (07/14/2022)  IMPRESSION:     Irregular spiculated nodule in the right upper lobe, indeterminate but suspicious for primary malignancy. CT PET and/or tissue sampling may be considered. At minimum, comparison with any available outside imaging and close follow-up is recommended.     Limited number of additional smaller nodules are nonspecific and can be reassessed on follow-up.     Bibasilar areas of dependent consolidation associated with retained airway secretions, likely atelectasis or scar. No specific findings to  suggest superimposed pneumonia.     PET Scan (07/16/2022)  IMPRESSION:   1. FDG avid cervical and thoracic lymphadenopathy, bilateral lung lesions, skeletal lesions, and soft tissue lesion in the lower abdomen/pelvis are suspicious for metastatic malignancy or less likely lymphoma.   2.  Asymmetric uptake in the right fossa of Rosenmuller is likely inflammatory, but correlation with exam is suggested.   3.  Focal uptake in the right nasopharynx is favored to represent physiologic uptake in the longest colli muscle, but reevaluation on follow-up is recommended. This could alternatively be evaluated with contrast-enhanced CT of the neck, if indicated.   4.  Asymmetric laryngeal uptake may reflect left vocal cord weakness.       Assessment and Plan     Michele Hopkins is a 77 y.o. 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 Today is Hospital Day 6.     I note the following medical issues:    #Acute L Basal Ganglia Ischemic Stroke  #Chronic Small Vessel Disease  #Bilateral Carotid bulb atherosclerosis, R ICA stenosis  Presented with R-sided hemiparesis and sensory loss. Reportedly had an NIHSS1 on arrival to OSH; [place] on arrival to [facility]. OSH CTH notable for several new, small, low-density areas within the pons / brainstem that could be acute or subacute infarcts vs artifact. Patient was outside window for TNK (LKW was 4/7). MRI significant for two punctate foci of acute ischemia in the left basal ganglia (did not clearly show brainstem infarct). Potential etiologies include cardioembolic vs thromboembolic vs large vessel atherosclerosis vs chronic microvascular disease vs cryptogenic. Risk stratification labs: A1c 5.2%, LDL 112. TTE showed no shunt or vegetation. Deep nuclei location is consistent with chronic small vessel disease, likely related to poorly controlled HTN and significant  smoking history.   - Continue CM[facility] tele  - Neurochecks Q4h  - Secondary stroke prevention: ASA 81 mg daily, Atorvastatin 80 mg QHS  - Will not discharge with an event monitor  - PT/OT/SLP - recommend IPR  - Stroke Nurse consult    #CAD s/p PCI to LAD  #HTN  Patient instructed to take daily ASA 81 mg, but frequently forgets and not listed in home medications. ECG from OSH normal sinus rhythm without ischemic changes. Troponin in normal range. TTE: EF 55-60%, no LVH, no RWMA, no identified thrombus, no shunt  - Continue ASA and Atorvastatin  - Continue home Lisinopril 20 mg daily, Metolazone 5 mg daily    #COPD  #Tobacco Use Disorder  #Incidental RUL pulmonary nodule  Home regimen includes Albuterol inhaler as-needed and Advair 1 puff BID. Does not use oxygen at baseline. CT neck from OSH noted incidental 2.2 cm pulmonary nodule in the right apex of the lung. Further  investigated with CT Chest (4/16) that is concerning - irregular spiculated nodule in the right upper lobe, indeterminate but suspicious for primary malignancy.  - Monitor WOB, O2 requirement  - Continue home inhalers  - Nicotine gum PRN  - Consider smoking cessation counseling  - PET scan concerning for malignancy with possible metastatic lesions  - Oncology consult    #HLD  Lipid Profile: Triglyc 122, HDL 46, LDL 112, T-Chol 182,   - Continue home Atorvastatin    #CKD Stage III  Baseline Cr unknown due to lack of medical records. However, documented CKD stage 3 from OSH records. Cr 0.75, BUN 15 on admission.   - Avoid nephrotoxic medications    #GERD  - Continue home Pantoprazole    #Neuropsych  - Continue home Sertraline, Gabapentin      Inpatient Checklist:    Diet: Regular Diet once no longer NPO  Diet/Nutrition Orders    Diet Regular(7)     Frequency: Effective Now     Number of Occurrences: Until Specified     Order Questions:      Suicide/Behavior Risk Modification? No     IV Fluids: None  Lines: PIV  Foley: No  Telemetry: Yes - stroke  Pain  management: Acetaminophen PRN  DVT prophylaxis: SQH  GI prophylaxis: PPI  Bowel Regimen: Suppository PRN  Precautions/Isolation: None  Ambulation: as tolerated  Ancillary services: PT/OT/SLP/SW  Code Status: Full Code  Dispo: Floor         PT Intervention Plan: Treatment/Interventions: LE strengthening/ROM, Therapeutic Activity, Therapeutic Exercise, Endurance training, Patient/family training, Equipment eval/education, Gait training, Press photographer, Stair Training  PT Frequency: minimum 3x/week       PT recs: Recommendation: IP Rehab  Equipment Recommended: Defer to facility to obtain, Rolling walker       OT Interventions Plan: Treatment Interventions: Activity Tolerance training, ADL retraining, IADL retraining, Energy Conservation, Equipment eval/education, Cognitive reorientation, Neuro muscular reeducation, Functional transfer training, Fine motor coordination activities, Patient/Family training, UE strengthening/ROM, Therapeutic Activity, Compensatory technique education, Excercise  OT Frequency: minimum 3x/week       OT recs: Recommendation: IP Rehab  Equipment Recommendations: Defer at this time       SLP recs: Diagnosis: WFL      Signed:    Karie Mainland, MD, PhD  [facility] Pediatric Neurology Resident, PGY-3    I saw and examined the patient on 07/17/22, and discussed the case with Dr. Lucretia Field and Ladona Ridgel and agree with the  findings and plan as documented in their note. Oncology consultation pending.  IPR in progress.     Orlena Sheldon, MD

## 2022-07-17 NOTE — Progress Notes (Signed)
[facility]  Medical Nutrition Therapy    Reason(s) for Completion: Nutrition Services Protocol - LOS 6 Days    Diet Order/Nutrition Support: Regular (7)    Chief Complaint: Stroke     Pertinent Information: Michele Hopkins is a 77 y.o. 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 First evaluated at OSH with [facility] revealing several new small low-density areas within the brainstem. CTA neck dense atheromatous calcification bilateral carotid bulbs and right proximal ICA (less than 50% reduction of origin of the right ICA). MRI with two small acute infarcts in the L basal ganglia. Incidental lung nodule further assessed on CT Chest with concern for malignancy.     Patient seen by nutrition services per LOS protocol. Nutrition Screening reviewed, positive for Difficulty Chewing or Swallowing (on R side). Documented PO intake of 100% of meals. Medical team at bedside at the time of visit. Will continue to monitor. +BM 4/18    Patient Active Problem List   Diagnosis    Stroke (CMS-HCC)    COPD (chronic obstructive pulmonary disease) (CMS-HCC)    HTN (hypertension)    HLD (hyperlipidemia)    CKD (chronic kidney disease) stage 3, GFR 30-59 ml/min (CMS-HCC)     Past Medical History:   Diagnosis Date    CAD (coronary artery disease)     Carotid arterial disease (CMS-HCC)     Chronic pain syndrome     CKD (chronic kidney disease) stage 3, GFR 30-59 ml/min (CMS-HCC)     CVA (cerebral vascular accident) (CMS-HCC)     Depression     Dysphagia     GERD with esophagitis     History of bladder cancer     HLD (hyperlipidemia)     HTN (hypertension)     Idiopathic peripheral neuropathy     PAD (peripheral artery disease) (CMS-HCC)     Solitary pulmonary nodule present on computed tomography of lung     TMJ syndrome     Vitamin D deficiency        Scheduled Meds:    ascorbic acid  (vitamin C)  500 mg Oral Daily 0900    aspirin  81 mg Oral Daily with breakfast    atorvastatin  80 mg Oral Nightly (2100)    budesonide-formoteroL  2 puff Inhalation RT BID    cyanocobalamin  1,000 mcg Oral Daily 0900    ergocalciferol  50,000 Units Oral Weekly    ezetimibe  10 mg Oral Nightly (2100)    gabapentin  200 mg Oral BID    heparin  5,000 Units Subcutaneous 3 times per day    lisinopriL  20 mg Oral Daily 0900    metOLazone  5 mg Oral Daily 0900    nicotine  1 patch Transdermal Daily 0900    pantoprazole  40 mg Oral QAM AC    polyethylene glycol  17 g Oral BID    senna-docusate  1 tablet Oral Nightly (2100)    sertraline  75 mg Oral Daily 0900      Continuous Infusions:   PRN Meds:acetaminophen **OR** acetaminophen, albuterol, bisacodyL, ED albuterol, guaiFENesin, lidocaine, nicotine **AND** nicotine (polacrilex), ondansetron     Pertinent Labs:   Lab Results   Component Value Date    CREATININE 0.96 07/17/2022    BUN 30 (H) 07/17/2022    NA 137  07/17/2022    K 4.0 07/17/2022    CL 101 07/17/2022    CO2 29 07/17/2022     Lab Results   Component Value Date    ALBUMIN 3.8 07/12/2022     Lab Results   Component Value Date    CALCIUM 9.2 07/17/2022    PHOS 3.1 07/17/2022     Lab Results   Component Value Date    MG 1.9 07/17/2022     Lab Results   Component Value Date    POCGMD 95 07/16/2022     Lab Results   Component Value Date    HGBA1C 5.2 07/12/2022     Skin Integrity: Bruising - bilat arms  Edema: WDL    Potential Nutrition Related Factor(s):  Skin Integrity  Social needs that may impact access to food: Never True per Nutrition Screening  Food Allergies/Intolerances: NKFA  Cultural Requests: None noted    77 y.o.   Female   Ht Readings from Last 1 Encounters:   07/11/22 5' 8 (1.727 m)     Wt Readings from Last 1 Encounters:   07/11/22 140 lb (63.5 kg)        Body mass index is 21.29 kg/m.   Ideal Body Weight: 140 lb (64 kg) +/- 10%  Other: 99% IBW  Weight History:   Wt Readings from Last 15 Encounters:    07/11/22 140 lb (63.5 kg)   07/12/22 139 lb (63 kg)     NFPE: Not indicated at this time.     Estimated Nutrition Needs:   Needs based On: CBW (64 kg)  Kcals/day: 1575-1890 (25-30 kcal/kg)  Protein g/day: 76-82 (1.2-1.3 g/kg)  Carbohydrate g/day: No hx of DM  Fluid ml/day: ~1 ml/kcal or per MD    Nutrition Related Problems:   Nutrition Diagnosis: Increased nutrient needs (pro, kcal)  Related To: Complex medical hx   As Evidenced By: Increased metabolic demand    Recommended Interventions: Monitor PO Intake/Tolerance  Goals:Total energy intake improved as evidenced by PO intake at least 75% of meals/supplements/snacks within 3-5 days    Discharge Planning and Education: Discharge plan currently for IPR. RD to remain available for coordination of care.     Follow up per nutrition services protocol while inpatient.    Recommendation(s) to Physician:   Monitor PO intake, weight, nutrition-related labs, and POC    Clovis Fredrickson, RD, LD  Clinical Dietitian   Contact via Epic messenger

## 2022-07-17 NOTE — Plan of Care (Signed)
Problem: Safety  Goal: Patient will be injury free during hospitalization  Description: Assess and monitor vitals signs, neurological status including level of consciousness and orientation. Assess patient's risk for falls and implement fall prevention plan of care and interventions per hospital policy.      Ensure arm band on, uncluttered walking paths in room, adequate room lighting, call light and overbed table within reach, bed in low position, wheels locked, side rails up per policy, and non-skid footwear provided.   Outcome: Progressing  Goal: Patient with weight > 350lbs will have appropriate equipment  Description: Consider ordering Bariatric Bed, Chair and Bedside Commode for patient weight > 350 lbs.  Outcome: Progressing     Problem: Patient will remain free of falls  Goal: Universal Fall Precautions  Outcome: Progressing     Problem: Daily Care  Goal: Daily care needs are met  Description: Assess and monitor ability to perform self care and identify potential discharge needs.  Outcome: Progressing     Problem: Psychosocial Needs  Goal: Demonstrates ability to cope with hospitalization/illness  Description: Assess and monitor patients ability to cope with his/her illness.  Outcome: Progressing     Problem: Discharge Barriers  Goal: Patient's discharge needs are met  Description: Collaborate with interdisciplinary team and initiate plans and interventions as needed.   Outcome: Progressing

## 2022-07-18 MED FILL — NICOTINE 14 MG/24 HR DAILY TRANSDERMAL PATCH: 14 14 mg/24 hr | TRANSDERMAL | Qty: 1

## 2022-07-18 MED FILL — LIDODERM 5 % TOPICAL PATCH: 5 5 % | TOPICAL | Qty: 1

## 2022-07-18 MED FILL — VITAMIN C 500 MG TABLET: 500 500 MG | ORAL | Qty: 1

## 2022-07-18 MED FILL — ATORVASTATIN 80 MG TABLET: 80 80 MG | ORAL | Qty: 1

## 2022-07-18 MED FILL — HEPARIN (PORCINE) 5,000 UNIT/ML INJECTION SOLUTION: 5000 5,000 unit/mL | INTRAMUSCULAR | Qty: 1

## 2022-07-18 MED FILL — POLYETHYLENE GLYCOL 3350 17 GRAM ORAL POWDER PACKET: 17 17 gram | ORAL | Qty: 1

## 2022-07-18 MED FILL — EZETIMIBE 10 MG TABLET: 10 10 mg | ORAL | Qty: 1

## 2022-07-18 MED FILL — GABAPENTIN 100 MG CAPSULE: 100 100 MG | ORAL | Qty: 2

## 2022-07-18 MED FILL — TYLENOL 325 MG TABLET: 325 325 mg | ORAL | Qty: 2

## 2022-07-18 MED FILL — STIMULANT LAXATIVE PLUS 8.6 MG-50 MG TABLET: 8.6-50 8.6-50 mg | ORAL | Qty: 1

## 2022-07-18 MED FILL — ASPIRIN 81 MG CHEWABLE TABLET: 81 81 MG | ORAL | Qty: 1

## 2022-07-18 MED FILL — SERTRALINE 50 MG TABLET: 50 50 MG | ORAL | Qty: 2

## 2022-07-18 MED FILL — VITAMIN B-12  1,000 MCG TABLET: 1000 1000 MCG | ORAL | Qty: 1

## 2022-07-18 MED FILL — METOLAZONE 5 MG TABLET: 5 5 MG | ORAL | Qty: 1

## 2022-07-18 NOTE — Progress Notes (Signed)
[facility]  Neurology Department     Fellow Attestation:     Discussed the case with the resident/medical student 07/18/22 and agree with the assessment and plan as documented in the note below.     Additional details:  Michele Hopkins is a 77 y.o. female with stroke (1980's), CAD, PAD, HTN, HLD, CKD3, COPD, tobacco use disorder, ambulatory with walker at baseline presenting 07/11/2022 for trouble standing, leaning to the right.     CTA with mild atherosclerosis of bilateral carotids. Incidental finding of  MRI brain with 2 punctate foci of acute stroke in L basal ganglia. TTE w/bubble EF 55-60%, no RWMA, no right-to-left shunt, no thrombus, L atrium normal. LDL 112, A1C 5.2%. PET scan with metastatic malignancy (unknown primary lesion) - Oncology suspects stage IV lung cancer.     Etiology of strokes may be small vessel - suggestion of subacute and acute with symptoms going on for a couple weeks. Cannot rule out embolic or hypercoagulable with active malignancy.     Oncology suspects stage IV lung cancer. Consult Interventional Pulm for tissue diagnosis. Cardiac event monitor at discharge. ASA 81mg  daily. Atorvastatin 80mg  daily. IPR placement pending.     Myrtice Lauth, MD PGY5 Vascular Neurology Fellow     Parts of the attestation above have been copied from previous documentation by me for continuity of care. All parts have been reviewed and updated based on additional history, examination, and data from today to reflect current management of this patient on this date.     [facility]  Department of Neurology and Rehab Medicine  Inpatient Progress Note    Chief Complaint / Brief Hospital Course     Michele Hopkins is a 77 y.o. female with a PMH significant for remote CVA (1980's), CAD s/p PCI to LAD (2005), PAD, COPD, tobacco use, HLD, HTN, and CKD stage 3, who was transferred from [facility] due to concerns for stroke. Had a fall from  the side of the bed on 4/8 without LOC; she reported feeling unsteady. After the fall, experienced R-sided weakness involving hand, arm, face. Symptoms began to improve so she did not seek medical evaluation. On 4/13, she sat down to drink coffee and was unable to stand up nor move the R side of her body, trouble speaking and swallowing. First evaluated at OSH with [facility] revealing several new small low-density areas within the brainstem. CTA neck dense atheromatous calcification bilateral carotid bulbs and right proximal ICA (less than 50% reduction of origin of the right ICA). MRI with two small acute infarcts in the L basal ganglia. Incidental lung nodule further assessed on CT Chest with concern for malignancy.    Today is Hospital Day 7.    Interval History / Subjective     No acute events overnight, no new neurologic concerns. SBP 100-110s. BM on 4/19    Oncology reviewed imaging and met with [facility] yesterday. PET findings appear to involve bilateral lungs and cervical lymph nodes, thereby being Stage IV. This is not considered curable and palliative chemotherapy is the only treatment option.    Social: lives 1 hour from [facility] with close friend    Review of Systems (focused)     Constitutional: Negative for fever.   Respiratory: Negative for cough and shortness of breath.  Cardiovascular: Negative for chest pain, palpitations  Gastrointestinal: Negative for abdominal pain, N/V, diarrhea and constipation.  Neurological: Negative for new numbness, tingling, or focal weakness  Medications   I personally reviewed all scheduled and PRN medications. All allergies were reviewed and updated.  See MAR for additional details.    Scheduled Medications:   ascorbic acid (vitamin C)  500 mg Oral Daily 0900    aspirin  81 mg Oral Daily with breakfast    atorvastatin  80 mg Oral Nightly (2100)    budesonide-formoteroL  2 puff Inhalation RT BID    cyanocobalamin  1,000 mcg Oral Daily 0900    ergocalciferol  50,000 Units Oral  Weekly    ezetimibe  10 mg Oral Nightly (2100)    gabapentin  200 mg Oral BID    heparin  5,000 Units Subcutaneous 3 times per day    lisinopriL  20 mg Oral Daily 0900    metOLazone  5 mg Oral Daily 0900    nicotine  1 patch Transdermal Daily 0900    pantoprazole  40 mg Oral QAM AC    polyethylene glycol  17 g Oral BID    senna-docusate  1 tablet Oral Nightly (2100)    sertraline  75 mg Oral Daily 0900       Current Facility-Administered Medications   Medication Dose Frequency Provider Last Admin    acetaminophen  650 mg Q4H PRN Chanetta Marshall, MD 650 mg at 07/17/22 1340    Or    acetaminophen  650 mg Q4H PRN Chanetta Marshall, MD      albuterol  2.5 mg RT Q4H PRN Chanetta Marshall, MD      ascorbic acid (vitamin C)  500 mg Daily 0900 Chanetta Marshall, MD 500 mg at 07/17/22 1610    aspirin  81 mg Daily with breakfast Chanetta Marshall, MD 81 mg at 07/17/22 9604    atorvastatin  80 mg Nightly (2100) Chanetta Marshall, MD 80 mg at 07/17/22 2029    bisacodyL  10 mg Daily PRN Karie Mainland, MD 10 mg at 07/16/22 1651    budesonide-formoteroL  2 puff RT BID Chanetta Marshall, MD 2 puff at 07/17/22 2057    cyanocobalamin  1,000 mcg Daily 0900 Chanetta Marshall, MD 1,000 mcg at 07/17/22 5409    ED albuterol  2 puff RT Q4H PRN Chanetta Marshall, MD      ergocalciferol  50,000 Units Weekly Chanetta Marshall, MD 50,000 Units at 07/12/22 1119    ezetimibe  10 mg Nightly (2100) Karie Mainland, MD 10 mg at 07/17/22 2030    gabapentin  200 mg BID Chanetta Marshall, MD 200 mg at 07/17/22 2029    guaiFENesin  600 mg BID PRN Ruthe Mannan, MD 600 mg at 07/13/22 1606    heparin  5,000 Units 3 times per day Chanetta Marshall, MD 5,000 Units at 07/18/22 0524    lidocaine  1 patch Daily PRN Karie Mainland, MD 1 patch at 07/17/22 0825    lisinopriL  20 mg Daily 0900 Chanetta Marshall, MD 20 mg at 07/17/22 8119    metOLazone  5 mg Daily 0900 Chanetta Marshall, MD 5 mg at 07/17/22 1478    nicotine  1 patch Daily 0900 Chanetta Marshall, MD 1 patch at 07/17/22 2956     And    nicotine (polacrilex)  2 mg Q1H PRN Chanetta Marshall, MD 2 mg at 07/13/22 0834    ondansetron  4 mg Q6H PRN Chanetta Marshall, MD      pantoprazole  40 mg QAM AC Chanetta Marshall, MD 40 mg at 07/17/22 0825    polyethylene glycol  17 g BID Laddie Aquas,  MD 17 g at 07/17/22 2030    senna-docusate  1 tablet Nightly (2100) Laddie Aquas, MD 1 tablet at 07/17/22 2030    sertraline  75 mg Daily 0900 Laddie Aquas, MD 75 mg at 07/17/22 0825       Vital Signs and Intake/Output   All vital signs during the previous 24 hours reviewed.     Temp:  [97.5 F (36.4 C)-98.1 F (36.7 C)] 98 F (36.7 C)  Heart Rate:  [62-68] 66  Resp:  [16-18] 18  BP: (100-117)/(45-67) 102/64      Intake/Output Summary (Last 24 hours) at 07/18/2022 0809  Last data filed at 07/17/2022 0849  Gross per 24 hour   Intake 140 ml   Output --   Net 140 ml       Physical Exam     General Medical Exam  CONSTITUTIONAL: well-appearing adult women in no acute distress, appears stated age  EYES: no conjunctival injection or scleral icterus  CV: warm and well perfused, no significant peripheral edema  RESP: no accessory muscle use, no increased WOB on nasal cannula, frequent coughing productive for mucous  SKIN: warm, dry, intact with normal turgor, no appreciable lesions    Neurologic Exam  Mental Status:   Level of consciousness: alerts spontaneously and to voice  Orientation: oriented to person, place, day, situation  Attention/concentration: able to attend exam well  Language: normal fluency/word-finding in conversation, no notable paraphasic errors, able to follow multi-step commands.  Speech: clear, coherent, goal-directed, spontaneous, without notable dysarthria  No left-right confusion. No signs of neglect on exam    Cranial Nerves:  CN II: Visual fields grossly intact, tracks across visual fields.  CN III/IV/VI: Gaze conjugate, EOMI with normal saccades/smooth pursuit, no nystagmus  CN V: Facial sensation to light touch intact in V1,  V2; subjectively diminished on R relative to L in V3  CN VII: Facial motion and strength intact and symmetric   CN VIII: Hearing grossly intact to conversation  CN IX/X: Strong phonation, cough  CN XI: Sternocleidomastoid strong bilaterally  CN XII: Tongue protrudes midline    Motor:   Decreased bulk appropriate to age.  No rest tremor or adventitial movements observed.    No pronator drift.     Strength: Raises and holds antigravity for all four extremities, but difficulty raising RLE to 45 degrees and drifts down    Coordination:  No dysmetria on finger-to-nose. Able to perform finger tapping.    Reflexes: Not tested today    Sensory: Reports diminished sensation on RUE/RLE relative to the L    Gait and Station:  Station: normal truncal control, rises from bed without assist.  Gait: normal gait and base with standard walk.      Diagnostic Studies   All diagnostic studies over the past 24 hours were reviewed by me and the attending. Pertinent findings discussed below in the A&P.    Labs:        Lab 07/17/22  0614 07/12/22  0054   SODIUM 137 139   POTASSIUM 4.0 3.0*   CHLORIDE 101 98   CO2 29 31   BUN 30* 15   CREATININE 0.96 0.75   GLUCOSE 103* 111*   MAGNESIUM 1.9  --    PHOSPHORUS 3.1  --          Lab 07/17/22  0614 07/12/22  0054   WBC 4.2 5.7   HEMOGLOBIN 12.5 13.6   HEMATOCRIT 36.9 39.3   MEAN CORPUSCULAR  VOLUME 87.5 85.0   PLATELETS 175 214       Radiology:    [facility] and CTA performed at OSH    MRI Brain (07/13/2022)    IMPRESSION:   1.  2 punctate foci of acute ischemia in the left basal ganglia measuring less than 1 mm each.       CT Chest (07/14/2022)  IMPRESSION:     Irregular spiculated nodule in the right upper lobe, indeterminate but suspicious for primary malignancy. CT PET and/or tissue sampling may be considered. At minimum, comparison with any available outside imaging and close follow-up is recommended.     Limited number of additional smaller nodules are nonspecific and can be reassessed on follow-up.      Bibasilar areas of dependent consolidation associated with retained airway secretions, likely atelectasis or scar. No specific findings to suggest superimposed pneumonia.     PET Scan (07/16/2022)  IMPRESSION:   1. FDG avid cervical and thoracic lymphadenopathy, bilateral lung lesions, skeletal lesions, and soft tissue lesion in the lower abdomen/pelvis are suspicious for metastatic malignancy or less likely lymphoma.   2.  Asymmetric uptake in the right fossa of Rosenmuller is likely inflammatory, but correlation with exam is suggested.   3.  Focal uptake in the right nasopharynx is favored to represent physiologic uptake in the longest colli muscle, but reevaluation on follow-up is recommended. This could alternatively be evaluated with contrast-enhanced CT of the neck, if indicated.   4.  Asymmetric laryngeal uptake may reflect left vocal cord weakness.       Assessment and Plan     Kelvin Burpee is a 77 y.o. 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 Today is Hospital Day 7.     I note the following medical issues:    #Acute L Basal Ganglia Ischemic Stroke  #Chronic Small Vessel Disease  #Bilateral Carotid bulb atherosclerosis, R ICA stenosis  Presented with R-sided hemiparesis and sensory loss. Reportedly had an NIHSS1 on arrival to OSH; [place] on arrival to [facility] C[facility] notable for several new, small, low-density areas within the pons / brainstem that could be acute or subacute infarcts vs artifact. Patient was outside window for TNK (LKW was 4/7). MRI significant for two punctate foci of acute ischemia in the left basal ganglia (did not clearly show brainstem infarct). Potential etiologies include cardioembolic vs thromboembolic vs large vessel atherosclerosis vs chronic microvascular disease vs cryptogenic. Risk stratification labs: A1c 5.2%, LDL 112. TTE showed no  shunt or vegetation. Deep nuclei location is consistent with chronic small vessel disease, likely related to poorly controlled HTN and significant smoking history.   - Continue CM[facility] tele  - Neurochecks Q4h  - Secondary stroke prevention: ASA 81 mg daily, Atorvastatin 80 mg QHS  - Will not discharge with an event monitor  - PT/OT/SLP - recommend IPR, working on placement and pre-cert  - Stroke Nurse consult    #COPD  #Tobacco Use Disorder  #Incidental RUL pulmonary nodule  Home regimen includes Albuterol inhaler as-needed and Advair 1 puff BID. Does not use oxygen at baseline. CT neck from OSH noted incidental 2.2 cm pulmonary nodule in the right apex of the lung. Further investigated with CT Chest (4/16) that is concerning - irregular spiculated nodule in the right upper lobe, indeterminate but suspicious for primary malignancy. PET scan showed additional nodules  in the cervical  lymph nodes. 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  - Monitor WOB, O2 requirement  - Continue home inhalers  - Nicotine gum PRN  - Consider smoking cessation counseling  - Oncology consulted - imaging findings suggestive of lung cancer    #H/o Bladder cancer  Patient reported a h/o bladder cancer. Details unknown as she had care at an OSH, information not in Care Everywhere. 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    #CAD s/p PCI to LAD  #HTN  Patient instructed to take daily ASA 81 mg, but frequently forgets and not listed in home medications. ECG from OSH normal sinus rhythm without ischemic changes. Troponin in normal range. TTE: EF 55-60%, no LVH, no RWMA, no identified thrombus, no shunt  - Continue ASA and Atorvastatin  - Continue home Lisinopril 20 mg daily, Metolazone 5 mg daily    #HLD  Lipid Profile: Triglyc 122, HDL 46, LDL 112, T-Chol 182,   - Continue home Atorvastatin    #CKD Stage III  Baseline Cr unknown due to lack of medical  records. However, documented CKD stage 3 from OSH records. Cr 0.75, BUN 15 on admission.   - Avoid nephrotoxic medications    #GERD  - Continue home Pantoprazole    #Neuropsych  - Continue home Sertraline, Gabapentin      Inpatient Checklist:    Diet: Regular Diet once no longer NPO  Diet/Nutrition Orders    Diet Regular(7)     Frequency: Effective Now     Number of Occurrences: Until Specified     Order Questions:      Suicide/Behavior Risk Modification? No     IV Fluids: None  Lines: PIV  Foley: No  Telemetry: Yes - stroke  Pain management: Acetaminophen PRN  DVT prophylaxis: SQH  GI prophylaxis: PPI  Bowel Regimen: Suppository PRN  Precautions/Isolation: None  Ambulation: as tolerated  Ancillary services: PT/OT/SLP/SW  Code Status: Full Code  Dispo: Floor         PT Intervention Plan: Treatment/Interventions: LE strengthening/ROM, Therapeutic Activity, Therapeutic Exercise, Endurance training, Patient/family training, Equipment eval/education, Gait training, Press photographer, Stair Training  PT Frequency: minimum 3x/week       PT recs: Recommendation: IP Rehab  Equipment Recommended: Defer to facility to obtain, Rolling walker       OT Interventions Plan: Treatment Interventions: Activity Tolerance training, ADL retraining, IADL retraining, Energy Conservation, Equipment eval/education, Cognitive reorientation, Neuro muscular reeducation, Functional transfer training, Fine motor coordination activities, Patient/Family training, UE strengthening/ROM, Therapeutic Activity, Compensatory technique education, Excercise  OT Frequency: minimum 3x/week       OT recs: Recommendation: IP Rehab  Equipment Recommendations: Defer at this time       SLP recs: Diagnosis: WFL      Signed:  Karie Mainland, MD, PhD  [facility] Pediatric Neurology Resident, PGY-3  07/18/2022    I saw and examined the patient on 07/18/22, and discussed the case with Dr. Lucretia Field and Ladona Ridgel and agree with the  findings and plan as documented in their  note.    Orlena Sheldon, MD

## 2022-07-18 NOTE — Progress Notes (Signed)
Patient with generalized pain today. Tylenol given and lidocaine patches. Encouraged patient to get up and walk or get to the chair. Patient had a bed bath and is now in the chair. Chair alarm on and call light in reach.

## 2022-07-18 NOTE — Plan of Care (Signed)
Problem: Safety  Goal: Patient will be injury free during hospitalization  Description: Assess and monitor vitals signs, neurological status including level of consciousness and orientation. Assess patient's risk for falls and implement fall prevention plan of care and interventions per hospital policy.      Ensure arm band on, uncluttered walking paths in room, adequate room lighting, call light and overbed table within reach, bed in low position, wheels locked, side rails up per policy, and non-skid footwear provided.   Outcome: Progressing  Goal: Patient with weight > 350lbs will have appropriate equipment  Description: Consider ordering Bariatric Bed, Chair and Bedside Commode for patient weight > 350 lbs.  Outcome: Progressing     Problem: Patient will remain free of falls  Goal: Universal Fall Precautions  Outcome: Progressing     Problem: Psychosocial Needs  Goal: Demonstrates ability to cope with hospitalization/illness  Description: Assess and monitor patients ability to cope with his/her illness.  Outcome: Progressing     Problem: Incontinence  Goal: Perineal skin integrity is maintained or improved  Description: Assess genitourinary system, perineal skin, labs (urinalysis), and history of incontinence to include past management, aggravating, and alleviating factors.  Collaborate with interdisciplinary team and initiate plans and interventions as needed.  Outcome: Progressing     Problem: Acute Pain  Description: Patient's pain progressing toward patient's stated pain goal  Goal: Patient displays improved well-being such as baseline levels for pulse, BP, respirations and relaxed muscle tone or body posture  Outcome: Progressing  Goal: Patient will manage pain with the appropriate technique/intervention  Description: Assess and monitor patient's pain using appropriate pain scale. Collaborate with interdisciplinary team and initiate plan and interventions as ordered.  Re-assess patient's pain level 30-60  minutes after pain management intervention.  Outcome: Progressing  Goal: Patient will reduce or eliminate use of analgesics  Outcome: Progressing  Goal: Patients pain is managed to allow active participation in daily activities  Outcome: Progressing  Goal: Patient verbalizes a reduction in pain level  Outcome: Progressing  Goal: Discharge Pain Management Plan (Acute Pain)  Outcome: Progressing

## 2022-07-18 NOTE — Consults (Signed)
INTERVENTIONAL PULMONOLOGY SERVICE      REASON FOR CONSULT   I was asked to see Michele Hopkins for RUL nodule, mediastinal/hilar and supraclavicular lymphadenopathy with PET avidity.    HISTORY OF PRESENT ILLNESS       Michele Hopkins is a 77 y.o. Other female with a history of bladder cancer in remission, tobacco dependence (>60 PY history), emphysema on imaging, CAD, who presented to the hospital for acute CVA right aphasia, slurred speech, right sided weakness.  She was found to have 2 punctate foci of acute stroke of left basal ganglia.  In her evaluation, she underwent imaging studies which showed right upper lobe spiculated nodule, as well as mediastinal/hilar and supraclavicular adenopathy.  PET was obtained which showed significant uptake in the upper lobe nodule, mediastinum as well as a left supraclavicular lymph node.  Consult was placed for left supraclavicular node sampling to determine underlying etiology without having to undergo general anesthesia peri-acute stroke.    In discussing with her, she has a significant smoking history, she smoked for ~60 years, her last cigarette was a week ago.  She has emphysema on CT but not on inhalers.  She lost 30 lbs in the past 2 years, 20 lbs in the past 6 months.  She did have a fall prior to admission for which she is describing left pleuritic chest pain.    Otherwise she reports that her strength is improve in her right upper extremity, speech has improved, sensation of her lips has improved, but still has weakness of the right lower extremity.    PAST MEDICAL HISTORY     Past Medical History:   Diagnosis Date    CAD (coronary artery disease)     Carotid arterial disease (CMS-HCC)     Chronic pain syndrome     CKD (chronic kidney disease) stage 3, GFR 30-59 ml/min (CMS-HCC)     CVA (cerebral vascular accident) (CMS-HCC)     Depression     Dysphagia     GERD with esophagitis     History of bladder cancer     HLD (hyperlipidemia)     HTN (hypertension)      Idiopathic peripheral neuropathy     PAD (peripheral artery disease) (CMS-HCC)     Solitary pulmonary nodule present on computed tomography of lung     TMJ syndrome     Vitamin D deficiency        PAST SURGICAL HISTORY     Past Surgical History:   Procedure Laterality Date    APPENDECTOMY      CHOLECYSTECTOMY      COLONOSCOPY  06/26/2022    CORONARY ANGIOPLASTY WITH STENT PLACEMENT  2005    LAD    ESOPHAGOGASTRODUODENOSCOPY  06/26/2022    HERNIA REPAIR  02/2022    Left inguinal    HYSTERECTOMY      REPLACEMENT TOTAL KNEE Right        MEDICATIONS     Current Facility-Administered Medications   Medication Dose Frequency Provider Last Admin    acetaminophen  650 mg Q4H PRN Chanetta Marshall, MD 650 mg at 07/18/22 1430    Or    acetaminophen  650 mg Q4H PRN Chanetta Marshall, MD      albuterol  2.5 mg RT Q4H PRN Chanetta Marshall, MD      ascorbic acid (vitamin C)  500 mg Daily 0900 Chanetta Marshall, MD 500 mg at 07/18/22 1020    aspirin  81 mg Daily with breakfast Chanetta Marshall,  MD 81 mg at 07/18/22 1024    atorvastatin  80 mg Nightly (2100) Chanetta Marshall, MD 80 mg at 07/17/22 2029    bisacodyL  10 mg Daily PRN Karie Mainland, MD 10 mg at 07/16/22 1651    budesonide-formoteroL  2 puff RT BID Chanetta Marshall, MD 2 puff at 07/18/22 0981    cyanocobalamin  1,000 mcg Daily 0900 Chanetta Marshall, MD 1,000 mcg at 07/18/22 1020    ED albuterol  2 puff RT Q4H PRN Chanetta Marshall, MD      ergocalciferol  50,000 Units Weekly Chanetta Marshall, MD 50,000 Units at 07/12/22 1119    ezetimibe  10 mg Nightly (2100) Karie Mainland, MD 10 mg at 07/17/22 2030    gabapentin  200 mg BID Chanetta Marshall, MD 200 mg at 07/18/22 1020    guaiFENesin  600 mg BID PRN Ruthe Mannan, MD 600 mg at 07/13/22 1606    heparin  5,000 Units 3 times per day Chanetta Marshall, MD 5,000 Units at 07/18/22 1227    lidocaine  1 patch Daily PRN Karie Mainland, MD 1 patch at 07/18/22 1021    lisinopriL  20 mg Daily 0900 Chanetta Marshall, MD 20 mg at 07/17/22 1914     metOLazone  5 mg Daily 0900 Chanetta Marshall, MD 5 mg at 07/18/22 1020    nicotine  1 patch Daily 0900 Chanetta Marshall, MD 1 patch at 07/18/22 1024    And    nicotine (polacrilex)  2 mg Q1H PRN Chanetta Marshall, MD 2 mg at 07/13/22 0834    ondansetron  4 mg Q6H PRN Chanetta Marshall, MD      pantoprazole  40 mg QAM AC Chanetta Marshall, MD 40 mg at 07/18/22 1021    polyethylene glycol  17 g BID Laddie Aquas, MD 17 g at 07/18/22 1020    senna-docusate  1 tablet Nightly (2100) Laddie Aquas, MD 1 tablet at 07/17/22 2030    sertraline  75 mg Daily 0900 Laddie Aquas, MD 75 mg at 07/18/22 1020       ALLERGIES     Allergies   Allergen Reactions    Ana-Kit Bee Sting [Epinephrine-Chlorpheniramine] Anaphylaxis    Sulfa (Sulfonamide Antibiotics) Swelling    Adhesive Tape-Silicones Other (See Comments)     Skin comes off        FAMILY HISTORY   Patient denies any family history of lung disease or thromboembolic disease.    Family History   Problem Relation Age of Onset    Breast Cancer Mother     Heart failure Mother     Diabetes Mother     Stroke Mother         SOCIAL HISTORY     Social History     Socioeconomic History    Marital status: Single     Spouse name: None    Number of children: 3    Years of education: None    Highest education level: None   Occupational History    None   Tobacco Use    Smoking status: Every Day     Packs/day: 1.00     Years: 20.00     Additional pack years: 0.00     Total pack years: 20.00     Types: Cigarettes     Start date: 09/12/1997    Smokeless tobacco: Never   Vaping Use    Vaping Use: Never used   Substance and Sexual Activity    Alcohol use:  Never    Drug use: Never    Sexual activity: Not Currently     Partners: Male   Other Topics Concern    None   Social History Narrative    None     Social Determinants of Health     Financial Resource Strain: Not on file   Food Insecurity: No Food Insecurity (07/11/2022)    Hunger Vital Sign     Worried About Running Out of Food in the Last  Year: Never true     Ran Out of Food in the Last Year: Never true   Transportation Needs: No Transportation Needs (07/11/2022)    PRAPARE - Therapist, art (Medical): No     Lack of Transportation (Non-Medical): No   Physical Activity: Not on file   Stress: Not on file   Social Connections: Not on file   Intimate Partner Violence: Not At Risk (07/11/2022)    Humiliation, Afraid, Rape, and Kick questionnaire     Fear of Current or Ex-Partner: No     Emotionally Abused: No     Physically Abused: No     Sexually Abused: No   Housing Stability: Low Risk  (07/11/2022)    Housing Stability Vital Sign     Unable to Pay for Housing in the Last Year: No     Number of Places Lived in the Last Year: 1     Unstable Housing in the Last Year: No       REVIEW OF SYSTEMS   A full 12-point review of systems was performed and was negative except as documented in the HPI.  VITALS     Vitals:    07/18/22 1624   BP: 104/60   Pulse: 70   Resp: 16   Temp: 98.4 F (36.9 C)   SpO2: 97%        EXAM   General:   No apparent distress, alert and oriented x 3.  Psych:   Mood is good, affect is normal.  CV:    Regular rate and rhythm. No JVD   Extremities:   No peripheral edema. No synovitis. Calves non tender  Lungs:   Clear.  Good air movement.  Respiratory effort is unlabored.  Abd:   Positive bowel sounds.  Soft.  Not tender to palpation.  Oropharynx:  Clear.  No thrush.  Neck:   Supple.  No stridor, no thyromegaly  Lymphatics:   No cervical or supraclavicular LAD.  Skin:    Warm and dry.  No rash  Eyes:   Normal sclera, nonicteric.  Neuro: RLL 3/5 strength, RUL 4/5, speech normal.     LABS         Lab 07/17/22  0614 07/12/22  0054   WBC 4.2 5.7   HEMATOCRIT 36.9 39.3   HEMOGLOBIN 12.5 13.6   PLATELETS 175 214         Lab 07/17/22  0614 07/12/22  0054   SODIUM 137 139   POTASSIUM 4.0 3.0*   CHLORIDE 101 98   CO2 29 31   BUN 30* 15   CREATININE 0.96 0.75   GLUCOSE 103* 111*   CALCIUM 9.2 9.1         Lab 07/12/22  0054    BILIRUBIN TOTAL 0.3   AST 13   ALT 10   ALK PHOS 103   ALBUMIN 3.8           Invalid input(s): HCO2ART  IMAGING     EXAM:  NM PET CT IMAGING  EXAM: SKULL TO THIGH    INDICATION: Abnormal xray - lung nodule, >= 1 cm    RADIOPHARMACEUTICAL:  12.33 millicurie of F-18 FDG INJECTION administered intravenously    TECHNICAL:  Positron emission tomography with concurrently acquired non-breath-hold low dose computed tomography was performed from above the skull base to the thigh. No IV contrast material was administered. Limited CT imaging is done for attenuation correction and anatomic localization purposes. It is limited by lower dose and respiratory motion. Injection site, right forearm.    Uptake time:  71 minutes  Fingerstick glucose at the time of radiopharmaceutical injection was 95 mg/dL.  Liver background mean SUV for reference is 2.0.    COMPARISON:  CT chest 07/14/2022      FINDINGS:    HEAD/NECK:  Asymmetric uptake in the right fossa of Rosenmuller, axial image 23.    Focal uptake in the right nasopharynx, maximum SUV 6 axial image 21.    Asymmetric uptake in the right vocal cord and right processed cricoid region, possibly cricopharyngeus muscle.    *  Left lower cervical lymph node maximum SUV 12.8 axial image 61.  *  Left level 5 lymph node maximum SUV 2.9 axial image 52.    CHEST:  Irregular right upper lobe lung nodule demonstrates increased uptake, maximum SUV 4.5 axial image 80.    Ovoid subpleural left lower lobe lung nodule maximum SUV 4.9 axial image 97.    Multiple FDG avid bilateral hilar and mediastinal lymph nodes, for example  *  Left hilum 6 maximum SUV 16.6 axial image 86  *  Subcarinal maximum SUV 13.3 axial image 85.    Coronary artery densities are consistent with calcifications and/or [.    ABDOMEN AND PELVIS:  Elongated soft tissue density along the anterior aspect of the sigmoid colon, maximum SUV 6.8 axial image 199.    Mildly avid low density left adrenal gland nodule likely  represents a benign adenoma. Prior cholecystectomy, hysterectomy.    Activity in the urinary and gastrointestinal tracts is likely physiologic.    SKELETON AND EXTREMITIES:  FDG avid lytic cortical lesion of the left femoral diaphysis, axial image 275.    Focal uptake posterior 10th rib, axial image 113.    Left fifth through seventh rib fractures with associated uptake.    Postsurgical changes of the proximal femurs bilaterally.      Impression   IMPRESSION:  1.  FDG avid cervical and thoracic lymphadenopathy, bilateral lung lesions, skeletal lesions, and soft tissue lesion in the lower abdomen/pelvis are suspicious for metastatic malignancy or less likely lymphoma.  2.  Asymmetric uptake in the right fossa of Rosenmuller is likely inflammatory, but correlation with exam is suggested.  3.  Focal uptake in the right nasopharynx is favored to represent physiologic uptake in the longest colli muscle, but reevaluation on follow-up is recommended. This could alternatively be evaluated with contrast-enhanced CT of the neck, if indicated.  4.  Asymmetric laryngeal uptake may reflect left vocal cord weakness.       ASSESSMENT AND PLAN      RUL nodule  -Spiculated, measuring 2.7 x 1.9 x 2.6 cm, concerning for primary bronchogenic carcinoma  -PET with mediastinal, hilar, and supraclavicular lymphadenopathy with uptake concerning for metastatic disease  -Will attempt left supraclavicular biopsy on 07/20/22 with local anesthesia as she is post-acute CVA    2. Left supraclavicular lymphadenopathy with PET uptake  -Concerning for metastatic  disease, would suspect primary lung etiology  -Will plan for biopsy on Monday, 07/20/22, she doesn't have to be NPO as we will use local anesthetic    3. Emphysema on CT  -Secondary to smoking history, okay for LABA/ICS with albuterol PRN  -Should get PFTs as outpatient, a1at level    4. Chronic tobacco dependence  -Reports quit for 1 week, motivated to quit indefinitely          Medical  Decision Making:  // LEVEL 4 MOD  One undiagnosed new problem  // LEVEL 4 needs 1/3  Independent interpretation of image(s)/tracing(s): PET, CT as above  Discussion re: minor surgery with risk factors      Thank you for the opportunity to participate in the care of this patient.    Salvatore Marvel, MD, DAABIP  Pulmonary/ Critical Care/ Interventional Pulmonary Attending  [phone] (Pager)

## 2022-07-19 LAB — CBC
Hematocrit: 40.3 % (ref 35.0–45.0)
Hemoglobin: 13.8 g/dL (ref 11.7–15.5)
MCH: 29.7 pg (ref 27.0–33.0)
MCHC: 34.2 g/dL (ref 32.0–36.0)
MCV: 86.9 fL (ref 80.0–100.0)
MPV: 8.8 fL (ref 7.5–11.5)
Platelets: 167 10*3/uL (ref 140–400)
RBC: 4.64 10*6/uL (ref 3.80–5.10)
RDW: 14.2 % (ref 11.0–15.0)
WBC: 5.7 10*3/uL (ref 3.8–10.8)

## 2022-07-19 LAB — RENAL FUNCTION PANEL W/EGFR
Albumin: 3.6 g/dL (ref 3.5–5.7)
Anion Gap: 8 mmol/L (ref 3–16)
BUN: 36 mg/dL — ABNORMAL HIGH (ref 7–25)
CO2: 30 mmol/L (ref 21–33)
Calcium: 9.5 mg/dL (ref 8.6–10.3)
Chloride: 97 mmol/L — ABNORMAL LOW (ref 98–110)
Creatinine: 1.04 mg/dL (ref 0.60–1.30)
EGFR: 55
Glucose: 107 mg/dL — ABNORMAL HIGH (ref 70–100)
Osmolality, Calculated: 289 mOsm/kg (ref 278–305)
Phosphorus: 4.5 mg/dL (ref 2.1–4.5)
Potassium: 4.3 mmol/L (ref 3.5–5.3)
Sodium: 135 mmol/L (ref 133–146)

## 2022-07-19 LAB — MAGNESIUM: Magnesium: 2.1 mg/dL (ref 1.5–2.5)

## 2022-07-19 MED ORDER — methocarbamoL (ROBAXIN) tablet 750 mg
500 | Freq: Four times a day (QID) | ORAL | Status: AC | PRN
Start: 2022-07-19 — End: 2022-07-22
  Administered 2022-07-19 – 2022-07-21 (×4): 750 mg via ORAL

## 2022-07-19 MED FILL — SERTRALINE 50 MG TABLET: 50 50 MG | ORAL | Qty: 2

## 2022-07-19 MED FILL — PANTOPRAZOLE 40 MG TABLET,DELAYED RELEASE: 40 40 MG | ORAL | Qty: 1

## 2022-07-19 MED FILL — POLYETHYLENE GLYCOL 3350 17 GRAM ORAL POWDER PACKET: 17 17 gram | ORAL | Qty: 1

## 2022-07-19 MED FILL — TYLENOL 325 MG TABLET: 325 325 mg | ORAL | Qty: 2

## 2022-07-19 MED FILL — LISINOPRIL 10 MG TABLET: 10 10 MG | ORAL | Qty: 2

## 2022-07-19 MED FILL — ERGOCALCIFEROL (VITAMIN D2) 1,250 MCG (50,000 UNIT) CAPSULE: 1250 1,250 mcg (50,000 unit) | ORAL | Qty: 1

## 2022-07-19 MED FILL — ATORVASTATIN 80 MG TABLET: 80 80 MG | ORAL | Qty: 1

## 2022-07-19 MED FILL — GABAPENTIN 100 MG CAPSULE: 100 100 MG | ORAL | Qty: 2

## 2022-07-19 MED FILL — STIMULANT LAXATIVE PLUS 8.6 MG-50 MG TABLET: 8.6-50 8.6-50 mg | ORAL | Qty: 1

## 2022-07-19 MED FILL — METOLAZONE 5 MG TABLET: 5 5 MG | ORAL | Qty: 1

## 2022-07-19 MED FILL — VITAMIN C 500 MG TABLET: 500 500 MG | ORAL | Qty: 1

## 2022-07-19 MED FILL — MUCINEX 600 MG TABLET, EXTENDED RELEASE: 600 600 mg | ORAL | Qty: 1

## 2022-07-19 MED FILL — LIDODERM 5 % TOPICAL PATCH: 5 5 % | TOPICAL | Qty: 1

## 2022-07-19 MED FILL — HEPARIN (PORCINE) 5,000 UNIT/ML INJECTION SOLUTION: 5000 5,000 unit/mL | INTRAMUSCULAR | Qty: 1

## 2022-07-19 MED FILL — VITAMIN B-12  1,000 MCG TABLET: 1000 1000 MCG | ORAL | Qty: 1

## 2022-07-19 MED FILL — ASPIRIN 81 MG CHEWABLE TABLET: 81 81 MG | ORAL | Qty: 1

## 2022-07-19 MED FILL — EZETIMIBE 10 MG TABLET: 10 10 mg | ORAL | Qty: 1

## 2022-07-19 MED FILL — NICOTINE 14 MG/24 HR DAILY TRANSDERMAL PATCH: 14 14 mg/24 hr | TRANSDERMAL | Qty: 1

## 2022-07-19 MED FILL — METHOCARBAMOL 500 MG TABLET: 500 500 MG | ORAL | Qty: 2

## 2022-07-19 NOTE — Progress Notes (Signed)
[facility]  Department of Neurology and Rehab Medicine  Inpatient Progress Note    Chief Complaint / Brief Hospital Course     Michele Hopkins is a 77 y.o. 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 Had a fall from the side of the bed on 4/8 without LOC; she reported feeling unsteady. After the fall, experienced R-sided weakness involving hand, arm, face. Symptoms began to improve so she did not seek medical evaluation. On 4/13, she sat down to drink coffee and was unable to stand up nor move the R side of her body, trouble speaking and swallowing. First evaluated at OSH with [facility] revealing several new small low-density areas within the brainstem. CTA neck dense atheromatous calcification bilateral carotid bulbs and right proximal ICA (less than 50% reduction of origin of the right ICA). MRI with two small acute infarcts in the L basal ganglia. Incidental lung nodule further assessed on CT Chest with concern for malignancy.    Today is Hospital Day 8.    Interval History / Subjective     No acute events overnight, no new neurologic concerns.    Michele Hopkins reports progressive worsening of her multifocal back pain. While the patient was living in [place] she was prescribed muscle relaxers at home that improved her back pain. We will trial Robaxin prn to control her pain while admitted.    Social: lives 1 hour from [facility] with close friend    Review of Systems (focused)     Constitutional: Negative for fever.   Respiratory: Negative for cough and shortness of breath.  Cardiovascular: Negative for chest pain, palpitations  Gastrointestinal: Negative for abdominal pain, N/V, diarrhea and constipation.  Neurological: Negative for new numbness, tingling, or focal weakness    Medications   I personally reviewed all scheduled and  PRN medications. All allergies were reviewed and updated.  See MAR for additional details.    Scheduled Medications:   ascorbic acid (vitamin C)  500 mg Oral Daily 0900    aspirin  81 mg Oral Daily with breakfast    atorvastatin  80 mg Oral Nightly (2100)    budesonide-formoteroL  2 puff Inhalation RT BID    cyanocobalamin  1,000 mcg Oral Daily 0900    ergocalciferol  50,000 Units Oral Weekly    ezetimibe  10 mg Oral Nightly (2100)    gabapentin  200 mg Oral BID    heparin  5,000 Units Subcutaneous 3 times per day    [Held by provider] lisinopriL  20 mg Oral Daily 0900    metOLazone  5 mg Oral Daily 0900    nicotine  1 patch Transdermal Daily 0900    pantoprazole  40 mg Oral QAM AC    polyethylene glycol  17 g Oral BID    senna-docusate  1 tablet Oral Nightly (2100)    sertraline  75 mg Oral Daily 0900       Current Facility-Administered Medications   Medication Dose Frequency Provider Last Admin    acetaminophen  650 mg Q4H PRN Chanetta Marshall, MD 650 mg at 07/19/22 1114    Or    acetaminophen  650 mg Q4H PRN Chanetta Marshall, MD      albuterol  2.5 mg RT Q4H PRN Chanetta Marshall, MD      ascorbic  acid (vitamin C)  500 mg Daily 0900 Chanetta Marshall, MD 500 mg at 07/19/22 1113    aspirin  81 mg Daily with breakfast Chanetta Marshall, MD 81 mg at 07/18/22 1024    atorvastatin  80 mg Nightly (2100) Chanetta Marshall, MD 80 mg at 07/18/22 2017    bisacodyL  10 mg Daily PRN Karie Mainland, MD 10 mg at 07/16/22 1651    budesonide-formoteroL  2 puff RT BID Chanetta Marshall, MD 2 puff at 07/19/22 0850    cyanocobalamin  1,000 mcg Daily 0900 Chanetta Marshall, MD 1,000 mcg at 07/19/22 1114    ED albuterol  2 puff RT Q4H PRN Chanetta Marshall, MD      ergocalciferol  50,000 Units Weekly Chanetta Marshall, MD 50,000 Units at 07/19/22 1113    ezetimibe  10 mg Nightly (2100) Karie Mainland, MD 10 mg at 07/18/22 2016    gabapentin  200 mg BID Chanetta Marshall, MD 200 mg at 07/19/22 1113    guaiFENesin  600 mg BID PRN Ruthe Mannan, MD 600 mg  at 07/19/22 1114    heparin  5,000 Units 3 times per day Chanetta Marshall, MD 5,000 Units at 07/19/22 0506    lidocaine  1 patch Daily PRN Karie Mainland, MD 1 patch at 07/19/22 1115    [Held by provider] lisinopriL  20 mg Daily 0900 Chanetta Marshall, MD 20 mg at 07/17/22 4540    methocarbamoL  750 mg 4x Daily PRN Ruthe Mannan, MD      metOLazone  5 mg Daily 0900 Chanetta Marshall, MD 5 mg at 07/19/22 1114    nicotine  1 patch Daily 0900 Chanetta Marshall, MD 1 patch at 07/19/22 1116    And    nicotine (polacrilex)  2 mg Q1H PRN Chanetta Marshall, MD 2 mg at 07/13/22 0834    ondansetron  4 mg Q6H PRN Chanetta Marshall, MD      pantoprazole  40 mg QAM AC Chanetta Marshall, MD 40 mg at 07/19/22 0506    polyethylene glycol  17 g BID Laddie Aquas, MD 17 g at 07/19/22 1115    senna-docusate  1 tablet Nightly (2100) Laddie Aquas, MD 1 tablet at 07/18/22 2017    sertraline  75 mg Daily 0900 Laddie Aquas, MD 75 mg at 07/19/22 1114       Vital Signs and Intake/Output   All vital signs during the previous 24 hours reviewed.     Temp:  [97.7 F (36.5 C)-98.4 F (36.9 C)] 97.7 F (36.5 C)  Heart Rate:  [69-78] 70  Resp:  [16-18] 16  BP: (92-127)/(47-68) 92/47      Intake/Output Summary (Last 24 hours) at 07/19/2022 1430  Last data filed at 07/19/2022 1115  Gross per 24 hour   Intake 480 ml   Output --   Net 480 ml       Physical Exam     General Medical Exam  CONSTITUTIONAL: well-appearing adult women in no acute distress, appears stated age  EYES: no conjunctival injection or scleral icterus  CV: warm and well perfused, no significant peripheral edema  RESP: no accessory muscle use, no increased WOB on nasal cannula, frequent coughing productive for mucous  SKIN: warm, dry, intact with normal turgor, no appreciable lesions    Neurologic Exam  Mental Status:   Level of consciousness: alerts spontaneously and to voice  Orientation: oriented to person, place, day, situation  Attention/concentration: able to attend exam  well  Language: normal fluency/word-finding  in conversation, no notable paraphasic errors, able to follow multi-step commands.  Speech: clear, coherent, goal-directed, spontaneous, without notable dysarthria  No left-right confusion. No signs of neglect on exam    Cranial Nerves:  CN II: Visual fields grossly intact, tracks across visual fields.  CN III/IV/VI: Gaze conjugate, EOMI with normal saccades/smooth pursuit, no nystagmus  CN V: Facial sensation to light touch intact in V1, V2; subjectively diminished on R relative to L in V3  CN VII: Facial motion and strength intact and symmetric   CN VIII: Hearing grossly intact to conversation  CN IX/X: Strong phonation, cough  CN XI: Sternocleidomastoid strong bilaterally  CN XII: Tongue protrudes midline    Motor:   Decreased bulk appropriate to age.  No rest tremor or adventitial movements observed.    No pronator drift.     Strength: Raises and holds antigravity for all four extremities, but difficulty raising RLE to 45 degrees and drifts down    Coordination:  No dysmetria on finger-to-nose. Able to perform finger tapping.    Reflexes: Not tested today    Sensory: Reports diminished sensation on RUE/RLE relative to the L    Gait and Station:  Station: normal truncal control, rises from bed without assist.  Gait: normal gait and base with standard walk.      Diagnostic Studies   All diagnostic studies over the past 24 hours were reviewed by me and the attending. Pertinent findings discussed below in the A&P.    Labs:        Lab 07/19/22  0821 07/17/22  0614   SODIUM 135 137   POTASSIUM 4.3 4.0   CHLORIDE 97* 101   CO2 30 29   BUN 36* 30*   CREATININE 1.04 0.96   GLUCOSE 107* 103*   MAGNESIUM 2.1 1.9   PHOSPHORUS 4.5 3.1         Lab 07/19/22  0821 07/17/22  0614   WBC 5.7 4.2   HEMOGLOBIN 13.8 12.5   HEMATOCRIT 40.3 36.9   MEAN CORPUSCULAR VOLUME 86.9 87.5   PLATELETS 167 175       Radiology:    [facility] and CTA performed at OSH    MRI Brain (07/13/2022)    IMPRESSION:   1.   2 punctate foci of acute ischemia in the left basal ganglia measuring less than 1 mm each.       CT Chest (07/14/2022)  IMPRESSION:     Irregular spiculated nodule in the right upper lobe, indeterminate but suspicious for primary malignancy. CT PET and/or tissue sampling may be considered. At minimum, comparison with any available outside imaging and close follow-up is recommended.     Limited number of additional smaller nodules are nonspecific and can be reassessed on follow-up.     Bibasilar areas of dependent consolidation associated with retained airway secretions, likely atelectasis or scar. No specific findings to suggest superimposed pneumonia.     PET Scan (07/16/2022)  IMPRESSION:   1. FDG avid cervical and thoracic lymphadenopathy, bilateral lung lesions, skeletal lesions, and soft tissue lesion in the lower abdomen/pelvis are suspicious for metastatic malignancy or less likely lymphoma.   2.  Asymmetric uptake in the right fossa of Rosenmuller is likely inflammatory, but correlation with exam is suggested.   3.  Focal uptake in the right nasopharynx is favored to represent physiologic uptake in the longest colli muscle, but reevaluation on follow-up is recommended. This could alternatively be evaluated with contrast-enhanced CT of the neck, if  indicated.   4.  Asymmetric laryngeal uptake may reflect left vocal cord weakness.       Assessment and Plan     Michele Hopkins is a 77 y.o. 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 Today is Hospital Day 8.     Plan for 07/19/22  - Interventional Pulmonology Consulted   - Plan for left supraclavicular biopsy on 07/20/22 with local anesthesia as she is post-acute CVA   - Patient does not need to be NPO for procedure    - Start Robaxin 750mg  PO QID prn    #Acute L Basal Ganglia Ischemic Stroke  #Chronic Small Vessel  Disease  #Bilateral Carotid bulb atherosclerosis, R ICA stenosis  Presented with R-sided hemiparesis and sensory loss. Reportedly had an NIHSS1 on arrival to OSH; [place] on arrival to [facility]. OSH CTH notable for several new, small, low-density areas within the pons / brainstem that could be acute or subacute infarcts vs artifact. Patient was outside window for TNK (LKW was 4/7). MRI significant for two punctate foci of acute ischemia in the left basal ganglia (did not clearly show brainstem infarct). Potential etiologies include cardioembolic vs thromboembolic vs large vessel atherosclerosis vs chronic microvascular disease vs cryptogenic. Risk stratification labs: A1c 5.2%, LDL 112. TTE showed no shunt or vegetation. Deep nuclei location is consistent with chronic small vessel disease, likely related to poorly controlled HTN and significant smoking history.     - Continue [facility] tele  - Neurochecks Q4h  - Secondary stroke prevention: ASA 81 mg daily, Atorvastatin 80 mg QHS  - Will not discharge with an event monitor  - PT/OT/SLP - recommend IPR, working on placement and pre-cert  - Stroke Nurse consult    #COPD  #Tobacco Use Disorder  #Incidental RUL pulmonary nodule  Home regimen includes Albuterol inhaler as-needed and Advair 1 puff BID. Does not use oxygen at baseline. CT neck from OSH noted incidental 2.2 cm pulmonary nodule in the right apex of the lung. Further investigated with CT Chest (4/16) that is concerning - irregular spiculated nodule in the right upper lobe, indeterminate but suspicious for primary malignancy. PET scan showed additional nodules in the cervical  lymph nodes. 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    - Monitor WOB, O2 requirement  - Continue home inhalers  - Nicotine gum PRN  - Consider smoking cessation counseling    - Plan for left supraclavicular biopsy on 07/20/22 with local anesthesia as she is post-acute CVA     [Oncology Consult] We  reviewed PET CT Finding with patient and discussed like Lung cancer since it is on both sides of Lung and Cervical LN it is Stage IV and not curable. Option would be palliative chemo/IO or combination and if any targets identified will be Oral. She lives hour away from [facility] for her long term cancer would be better at local oncologist but we can help to establish diagnosis and treatment plan while she is in area (hospital + Inpt rehab). Will make an appointment for her to see Dr. Karen Chafe upon discharge.     [Interventional Pulmonology Consult] (updated 4/21) Concerning for metastatic disease, would suspect primary lung etiology. RUL nodule spiculated, measuring 2.7 x 1.9 x 2.6 cm, concerning for primary bronchogenic carcinoma. PET with mediastinal, hilar, and supraclavicular lymphadenopathy with uptake concerning  for metastatic disease    #H/o Bladder cancer  Patient reported a h/o bladder cancer. Details unknown as she had care at an OSH, information not in Care Everywhere. 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    #CAD s/p PCI to LAD  #HTN  Patient instructed to take daily ASA 81 mg, but frequently forgets and not listed in home medications. ECG from OSH normal sinus rhythm without ischemic changes. Troponin in normal range. TTE: EF 55-60%, no LVH, no RWMA, no identified thrombus, no shunt    - Continue ASA and Atorvastatin  - Continue home Lisinopril 20 mg daily, Metolazone 5 mg daily    #HLD  Lipid Profile: Triglyc 122, HDL 46, LDL 112, T-Chol 182,     - Continue home Atorvastatin    #CKD Stage III  Baseline Cr unknown due to lack of medical records. However, documented CKD stage 3 from OSH records. Cr 0.75, BUN 15 on admission.   - Avoid nephrotoxic medications    #GERD  - Continue home Pantoprazole    #Neuropsych  - Continue home Sertraline, Gabapentin    #Back Pain, Chronic  - Continue acetaminophen 650mg  PO q6h prn  - Start Robaxin 750mg  PO QID prn for muscle  spasms      Inpatient Checklist:    Diet: Regular Diet once no longer NPO  Diet/Nutrition Orders    Diet Regular(7)     Frequency: Effective Now     Number of Occurrences: Until Specified     Order Questions:      Suicide/Behavior Risk Modification? No     IV Fluids: None  Lines: PIV  Foley: No  Telemetry: Yes - stroke  Pain management: Acetaminophen PRN  DVT prophylaxis: SQH  GI prophylaxis: PPI  Bowel Regimen: Suppository PRN  Precautions/Isolation: None  Ambulation: as tolerated  Ancillary services: PT/OT/SLP/SW  Code Status: Full Code  Dispo: Floor         PT Intervention Plan: Treatment/Interventions: LE strengthening/ROM, Therapeutic Activity, Therapeutic Exercise, Endurance training, Patient/family training, Equipment eval/education, Gait training, Press photographer, Stair Training  PT Frequency: minimum 3x/week       PT recs: Recommendation: IP Rehab  Equipment Recommended: Defer to facility to obtain, Rolling walker       OT Interventions Plan: Treatment Interventions: Activity Tolerance training, ADL retraining, IADL retraining, Energy Conservation, Equipment eval/education, Cognitive reorientation, Neuro muscular reeducation, Functional transfer training, Fine motor coordination activities, Patient/Family training, UE strengthening/ROM, Therapeutic Activity, Compensatory technique education, Excercise  OT Frequency: minimum 3x/week       OT recs: Recommendation: IP Rehab  Equipment Recommendations: Defer at this time       SLP recs: Diagnosis: WFL      Ruthe Mannan, MD  Internal Medicine Resident, PGY-1  [phone]     I saw and examined the patient on 07/19/22, and discussed the case with Dr. Terrence Dupont and agree with the  findings and plan as documented in his note.  Intervention pulmonology to go after biopsy tomorrow.     Orlena Sheldon, MD

## 2022-07-19 NOTE — Plan of Care (Signed)
Problem: Safety  Goal: Patient will be injury free during hospitalization  Description: Assess and monitor vitals signs, neurological status including level of consciousness and orientation. Assess patient's risk for falls and implement fall prevention plan of care and interventions per hospital policy.      Ensure arm band on, uncluttered walking paths in room, adequate room lighting, call light and overbed table within reach, bed in low position, wheels locked, side rails up per policy, and non-skid footwear provided.   Outcome: Progressing  Goal: Patient with weight > 350lbs will have appropriate equipment  Description: Consider ordering Bariatric Bed, Chair and Bedside Commode for patient weight > 350 lbs.  Outcome: Progressing     Problem: Patient will remain free of falls  Goal: Universal Fall Precautions  Outcome: Progressing     Problem: Daily Care  Goal: Daily care needs are met  Description: Assess and monitor ability to perform self care and identify potential discharge needs.  Outcome: Progressing     Problem: Psychosocial Needs  Goal: Demonstrates ability to cope with hospitalization/illness  Description: Assess and monitor patients ability to cope with his/her illness.  Outcome: Progressing     Problem: Patient will remain free of injury d/t fall  Goal: High Fall Risk Precautions  Outcome: Progressing  Goal: Additional High Fall Risk Precautions (consider the following)  Outcome: Progressing     Problem: Acute Pain  Description: Patient's pain progressing toward patient's stated pain goal  Goal: Patient displays improved well-being such as baseline levels for pulse, BP, respirations and relaxed muscle tone or body posture  Outcome: Progressing  Goal: Patient will manage pain with the appropriate technique/intervention  Description: Assess and monitor patient's pain using appropriate pain scale. Collaborate with interdisciplinary team and initiate plan and interventions as ordered.  Re-assess  patient's pain level 30-60 minutes after pain management intervention.  Outcome: Progressing  Goal: Patient will reduce or eliminate use of analgesics  Outcome: Progressing  Goal: Patients pain is managed to allow active participation in daily activities  Outcome: Progressing  Goal: Patient verbalizes a reduction in pain level  Outcome: Progressing  Goal: Discharge Pain Management Plan (Acute Pain)  Outcome: Progressing     Problem: Incontinence  Goal: Perineal skin integrity is maintained or improved  Description: Assess genitourinary system, perineal skin, labs (urinalysis), and history of incontinence to include past management, aggravating, and alleviating factors.  Collaborate with interdisciplinary team and initiate plans and interventions as needed.  Outcome: Progressing

## 2022-07-20 ENCOUNTER — Inpatient Hospital Stay: Admit: 2022-07-20 | Payer: Medicare (Managed Care)

## 2022-07-20 LAB — RENAL FUNCTION PANEL W/EGFR
Albumin: 3.6 g/dL (ref 3.5–5.7)
Anion Gap: 10 mmol/L (ref 3–16)
BUN: 31 mg/dL — ABNORMAL HIGH (ref 7–25)
CO2: 27 mmol/L (ref 21–33)
Calcium: 9.6 mg/dL (ref 8.6–10.3)
Chloride: 98 mmol/L (ref 98–110)
Creatinine: 0.81 mg/dL (ref 0.60–1.30)
EGFR: 74
Glucose: 99 mg/dL (ref 70–100)
Osmolality, Calculated: 287 mOsm/kg (ref 278–305)
Phosphorus: 4 mg/dL (ref 2.1–4.5)
Potassium: 3.7 mmol/L (ref 3.5–5.3)
Sodium: 135 mmol/L (ref 133–146)

## 2022-07-20 LAB — CBC
Hematocrit: 38.8 % (ref 35.0–45.0)
Hemoglobin: 13.1 g/dL (ref 11.7–15.5)
MCH: 29.5 pg (ref 27.0–33.0)
MCHC: 33.9 g/dL (ref 32.0–36.0)
MCV: 87 fL (ref 80.0–100.0)
MPV: 9 fL (ref 7.5–11.5)
Platelets: 183 10*3/uL (ref 140–400)
RBC: 4.46 10*6/uL (ref 3.80–5.10)
RDW: 14.1 % (ref 11.0–15.0)
WBC: 5.4 10*3/uL (ref 3.8–10.8)

## 2022-07-20 LAB — MAGNESIUM: Magnesium: 2 mg/dL (ref 1.5–2.5)

## 2022-07-20 MED FILL — TYLENOL 325 MG TABLET: 325 325 mg | ORAL | Qty: 2

## 2022-07-20 MED FILL — METHOCARBAMOL 500 MG TABLET: 500 500 MG | ORAL | Qty: 2

## 2022-07-20 MED FILL — BISACODYL 10 MG RECTAL SUPPOSITORY: 10 10 mg | RECTAL | Qty: 1

## 2022-07-20 MED FILL — NICOTINE 14 MG/24 HR DAILY TRANSDERMAL PATCH: 14 14 mg/24 hr | TRANSDERMAL | Qty: 1

## 2022-07-20 MED FILL — METOLAZONE 5 MG TABLET: 5 5 MG | ORAL | Qty: 1

## 2022-07-20 MED FILL — VITAMIN C 500 MG TABLET: 500 500 MG | ORAL | Qty: 1

## 2022-07-20 MED FILL — HEPARIN (PORCINE) 5,000 UNIT/ML INJECTION SOLUTION: 5000 5,000 unit/mL | INTRAMUSCULAR | Qty: 1

## 2022-07-20 MED FILL — PANTOPRAZOLE 40 MG TABLET,DELAYED RELEASE: 40 40 MG | ORAL | Qty: 1

## 2022-07-20 MED FILL — GABAPENTIN 100 MG CAPSULE: 100 100 MG | ORAL | Qty: 2

## 2022-07-20 MED FILL — EZETIMIBE 10 MG TABLET: 10 10 mg | ORAL | Qty: 1

## 2022-07-20 MED FILL — STIMULANT LAXATIVE PLUS 8.6 MG-50 MG TABLET: 8.6-50 8.6-50 mg | ORAL | Qty: 1

## 2022-07-20 MED FILL — ASPIRIN 81 MG CHEWABLE TABLET: 81 81 MG | ORAL | Qty: 1

## 2022-07-20 MED FILL — POLYETHYLENE GLYCOL 3350 17 GRAM ORAL POWDER PACKET: 17 17 gram | ORAL | Qty: 1

## 2022-07-20 MED FILL — SERTRALINE 50 MG TABLET: 50 50 MG | ORAL | Qty: 2

## 2022-07-20 MED FILL — ATORVASTATIN 80 MG TABLET: 80 80 MG | ORAL | Qty: 1

## 2022-07-20 MED FILL — VITAMIN B-12  1,000 MCG TABLET: 1000 1000 MCG | ORAL | Qty: 1

## 2022-07-20 NOTE — Procedures (Signed)
Interventional Pulmonology Service  Op Note    Name: Michele Hopkins  Age/Sex: 76 y.o. female   MRN: [id number]  Date of Service: 07/20/2022    Indications:  Left supraclavicular lymphadenopathy  Concern for metastatic malignancy    CPT Code:  [38505] Core Biopsy or Excision of Lymph Node      Medications:    10 mL 2% lidocaine without epinephrine    Pre-procedure diagnosis:  Left supraclavicular lymphadenopathy    Post-procedure diagnosis:  Same    Operators:  Marijean Bravo, MD  Salvatore Marvel, MD    Pre-procedure Ultrasound:    Prior to procedure ultrasound of the left supraclavicular space was performed to confirm anatomy, ensure no vascularity was traversing the operative site, and ensure the patient would be an appropriate candidate for supraclavicular lymph node biopsy. Left supraclavicular lymph node was easily identified. Lymph node was significantly enlarged and abnormal appearing. The operative site was marked.    Procedure:    Consent obtained prior to procedure from patient and documented in chart.      Patient placed in the semi-recumbent position. Operator prepped in sterile fashion. Patient prepped and draped in sterile fashion over left supraclavicular space after visualizing previously denoted operative site. Time out was called. Local anesthesia was provided with 2% lidocaine without epinephrine at the denoted operative site. Dynamic ultrasound was utilized throughout the procedure to sample the Left supraclavicular lymph node. A 16 gauge BioPince core biopsy device with a 13 mm throw was then used to obtain core biopsies which were placed into formalin. Adequate hemostasis was noted after all sampling was performed. A sterile occlusive dressing was applied. Procedure was complete.    Findings:  Abnormal appearing left supraclavicular lymph nodes    Specimen:  Left supraclavicular lymph node core biopsies    Complications:  No immediate complications  Blood Loss:  Minimal  Procedure tolerated:   Well  Condition of patient after procedure:  Stable    Recommendations:  Local dressing change as needed  Await results of lymph node core needle biopsy    Salvatore Marvel, MD  Interventional Pulmonology

## 2022-07-20 NOTE — Plan of Care (Signed)
Problem: Inadequate Gas Exchange  Goal: Patient is adequately oxygenated and ventilation is improved  Description: Assess and monitor vital signs, oxygen saturation, respiratory status to include rate, depth, effort, and lung sounds, mental status, cyanosis, and labs (ABG's).  Monitor effects of medications that may sedate the patient.  Collaborate with respiratory therapy to administer medications and treatments.  Outcome: Progressing

## 2022-07-20 NOTE — Progress Notes (Signed)
Physical Therapy  Treatment    Name: Michele Hopkins  DOB: 1946/03/02  Attending Physician: Aurea Graff, MD  Admission Diagnosis: possible stroke  Date: 07/20/2022  Room: 1610/R6045  Reviewed Pertinent hospital course: Yes    Hospital Course PT/OT: 77 y/o F p/w R sided weakness. OSH CT head: several new small low-density areas in brainstem, acute/subacute infarcts vs artifact. 4/15: MRI head significant for small acute infarct in the L basal ganglia 4/18: PET scan- significant uptake in the upper lobe nodule, mediastinum as well as a left supraclavicular lymph node. 4/22- Pending IR for bx  Relevant PMH : CVA (1980's), CAD s/p PCI to LAD (2005), PAD, COPD, tobacco use, HLD, HTN, and CKD stage 3  Precautions: none  Activity Level: Activity as tolerated    Assist: None    Assessment  Michele Hopkins is cooperative with PT today with improvement in her gait activity tolerance from previous sessions.  She continues to require minimal assistance for functional transfers and is functioning below her baseline.  She would continue to benefit from PT at Rehab upon discharge to maximize her functional independence.          Recommendation  Recommendation: IP Rehab  Equipment Recommended: Defer to facility to obtain, Rolling walker    Justification for DME ordered: Lyda Perone: Patient has decreased weight bearing or impaired balance putting them at risk for falling without use of a walker. They are unable to utilize crutches or a cane to provide adequate support    AM-PAC 6 Clicks Basic Mobility Inpatient Short Form: PT 6 Clicks Score: 17     Mobility Recommendations for Staff  Patient ability: Patient ambulates in room/ to bathroom, Patient ambulates in hallway  Assist needed: with 1 person assist  Equipment/ Precautions needed: Requires assistive device, use gait belt  Requires Assistive Device: Rolling walker    Cognition  Overall Cognitive Status: Within Functional Limits  Arousal/Alertness: Alert  Orientation Level:  Oriented X4  Behavior: Appropriate;Cooperative  Following Commands: Follows all commands and directions without difficulty  Safety Judgment: Good awareness of safety precautions  Insight: Demonstrated intact insight into limitation and abilities to complete ADL's safely  Communication: Verbalization     Pain  Pain Score: 6   Pain Location: Neck (at recent bx site)  Pain Descriptors: Aching  Pain Intervention(s): Ambulation/increased activity  Therapist reported pain to: RN aware     Mobility  Bed Mobility  Supine to Sit: Contact Guard assistance;head of bed elevated;increased time to complete task;towards the right  Transfers  Sit to Stand: Minimal assistance;increased time to complete task;up to assistive device (from EOB and commode using grab bar)  Sit to Stand Assistive Device: Rolling walker  Stand to Sit: Contact Guard assistance (with decreased eccentric control)  Gait  Distance: 10 ft and 125 ft  Level of Assistance: Contact Guard assistance  Assistive Device: Rolling walker  Gait Characteristics: R decreased step length;L decreased step length;Shuffling;decreased cadence;No LOB  Balance  Sitting - Static: Supervision  Sitting - Dynamic: Supervision  Standing - Static: Contact Guard Assistance;With Assistive Device   Standing-Static Assistive Device: Rolling walker  Standing - DynamicContractor;With Assistive Device  Standing-Dynamic Assistive Device: Rolling walker    Gait belt used: Yes       Position after Treatment and Safety Handoff  Position after treatment and safety handoff  Position after therapy session: Chair  Details: RN notified;Call light/ needs within reach  Alarms: Chair  Alarms Status: Activated and Interfaced with call system  Goals  Goals Met: Gait    Collaborated with: Patient  Patient Stated Goal: to return to baseline/PLOF  Goals to be met by: 07/27/22  Patient will transition from supine to sit: Supervision  Patient will transition from sit to supine:  Supervision  Patient will transfer from sit to stand: Contact Guard assistance, up to assistive device (RW)  Patient will ambulate: Stand-By assistance, with assistive device   Ambulation Assistance Device: Rolling walker  Distance (in feet): 200 ft  Patient will go up / down stairs: Will tolerate assessment  Patient will  participate in bilateral lower extremeity HEP in preperation for further functional mobility: 10 repetitions  Pt Will report pain with functional mobility at: 4/10 or less  Long-term goal to be met by: 08/03/22  Long Term Goal : Pt will ambulate 300 ft with modified indepence using LRAD    Patient/Family Education  Educated patient on the role of physical therapy, goals, plan of care, importance of increased activity, discharge recommendations, transfer training, and gait training and fall prevention strategies, including need for supervision/ assistance with OOB activity and use of call light; patient verbalized understanding, needed cues, and will need reinforcement. Handout(s) issued: none.    Plan  Plan  Treatment/Interventions: LE strengthening/ROM, Therapeutic Activity, Therapeutic Exercise, Endurance training, Patient/family training, Equipment eval/education, Gait training, Neuromuscular Reeducation, Stair Training  PT Frequency: minimum 3x/week    The plan of care and recommendations assesses the patient's and/or caregiver's readiness, willingness, and ability to provide or support functional mobility and ADL tasks as needed upon discharge.      Time  Start Time: 1035  Stop Time: 1100  Time Calculation (min): 25 min    Charges     $Gait/Mobility: 8-22 mins  $Therapeutic Activity: 1 unit             Problem List  Patient Active Problem List   Diagnosis    Stroke (CMS-HCC)    COPD (chronic obstructive pulmonary disease) (CMS-HCC)    HTN (hypertension)    HLD (hyperlipidemia)    CKD (chronic kidney disease) stage 3, GFR 30-59 ml/min (CMS-HCC)        Past Medical History  Past Medical  History:   Diagnosis Date    CAD (coronary artery disease)     Carotid arterial disease (CMS-HCC)     Chronic pain syndrome     CKD (chronic kidney disease) stage 3, GFR 30-59 ml/min (CMS-HCC)     CVA (cerebral vascular accident) (CMS-HCC)     Depression     Dysphagia     GERD with esophagitis     History of bladder cancer     HLD (hyperlipidemia)     HTN (hypertension)     Idiopathic peripheral neuropathy     PAD (peripheral artery disease) (CMS-HCC)     Solitary pulmonary nodule present on computed tomography of lung     TMJ syndrome     Vitamin D deficiency         Past Surgical History  Past Surgical History:   Procedure Laterality Date    APPENDECTOMY      CHOLECYSTECTOMY      COLONOSCOPY  06/26/2022    CORONARY ANGIOPLASTY WITH STENT PLACEMENT  2005    LAD    ESOPHAGOGASTRODUODENOSCOPY  06/26/2022    HERNIA REPAIR  02/2022    Left inguinal    HYSTERECTOMY      REPLACEMENT TOTAL KNEE Right

## 2022-07-20 NOTE — Progress Notes (Signed)
[facility]  Neurology Department     Fellow Attestation:     Discussed the case with the resident/medical student 07/20/22 and agree with the assessment and plan as documented in the note below.     Additional details:  Michele Hopkins is a 77 y.o. female with stroke (1980's), CAD, PAD, HTN, HLD, CKD3, COPD, tobacco use disorder, ambulatory with walker at baseline presenting 07/11/2022 for trouble standing, leaning to the right.     CTA with mild atherosclerosis of bilateral carotids. Incidental finding of  MRI brain with 2 punctate foci of acute stroke in L basal ganglia. TTE w/bubble EF 55-60%, no RWMA, no right-to-left shunt, no thrombus, L atrium normal. LDL 112, A1C 5.2%. PET scan with metastatic malignancy (unknown primary lesion) - Oncology suspects stage IV lung cancer.    Biopsy 07/20/2022 of L supraclavicular lymph node.    Exam 07/20/22 alert and oriented x 3. Strength and sensation intact.     Etiology of strokes may be small vessel - suggestion of subacute and acute with symptoms going on for a couple weeks. Cannot rule out embolic or hypercoagulable with active malignancy.     Oncology suspects stage IV lung cancer. Biopsy of supraclavicular lymph node 07/20/22. Cardiac event monitor at discharge. ASA 81mg  daily. Atorvastatin 80mg  daily. IPR placement pending.     Myrtice Lauth, MD PGY5 Vascular Neurology Fellow     Parts of the attestation above have been copied from previous documentation by me for continuity of care. All parts have been reviewed and updated based on additional history, examination, and data from today to reflect current management of this patient on this date.     [facility]  Department of Neurology and Rehab Medicine  Inpatient Progress Note    Chief Complaint / Brief Hospital Course     Michele Hopkins is a 77 y.o. female with a PMH significant for remote CVA (1980's), CAD s/p PCI to LAD (2005), PAD, COPD, tobacco use, HLD,  HTN, and CKD stage 3, who was transferred from [facility] due to concerns for stroke. Had a fall from the side of the bed on 4/8 without LOC; she reported feeling unsteady. After the fall, experienced R-sided weakness involving hand, arm, face. Symptoms began to improve so she did not seek medical evaluation. On 4/13, she sat down to drink coffee and was unable to stand up nor move the R side of her body, trouble speaking and swallowing. First evaluated at OSH with [facility] revealing several new small low-density areas within the brainstem. CTA neck dense atheromatous calcification bilateral carotid bulbs and right proximal ICA (less than 50% reduction of origin of the right ICA). MRI with two small acute infarcts in the L basal ganglia. Incidental lung nodule further assessed on CT Chest with concern for malignancy.    Today is Hospital Day 9.    Interval History / Subjective     No acute events overnight, no new neurologic concerns. SBPs 90s-100s.    Back pain still present. Unsure if Robaxin helped last night, amenable to trying again this morning and seeing how it goes.    Social: lives 1 hour from [facility] with close friend. They were able to visit [place] over the weekend Harriett Sine). Sarina has also been talking with her two sons.    Review of Systems (focused)     Constitutional: Negative for fever.   Respiratory: Negative for cough and shortness of breath.  Cardiovascular: Negative  for chest pain, palpitations  Gastrointestinal: Negative for abdominal pain, N/V, diarrhea and constipation.  Neurological: Negative for new numbness, tingling, or focal weakness    Medications   I personally reviewed all scheduled and PRN medications. All allergies were reviewed and updated.  See MAR for additional details.    Scheduled Medications:   ascorbic acid (vitamin C)  500 mg Oral Daily 0900    aspirin  81 mg Oral Daily with breakfast    atorvastatin  80 mg Oral Nightly (2100)    budesonide-formoteroL  2 puff  Inhalation RT BID    cyanocobalamin  1,000 mcg Oral Daily 0900    ergocalciferol  50,000 Units Oral Weekly    ezetimibe  10 mg Oral Nightly (2100)    gabapentin  200 mg Oral BID    heparin  5,000 Units Subcutaneous 3 times per day    [Held by provider] lisinopriL  20 mg Oral Daily 0900    metOLazone  5 mg Oral Daily 0900    nicotine  1 patch Transdermal Daily 0900    pantoprazole  40 mg Oral QAM AC    polyethylene glycol  17 g Oral BID    senna-docusate  1 tablet Oral Nightly (2100)    sertraline  75 mg Oral Daily 0900       Current Facility-Administered Medications   Medication Dose Frequency Provider Last Admin    acetaminophen  650 mg Q4H PRN Chanetta Marshall, MD 650 mg at 07/19/22 2107    Or    acetaminophen  650 mg Q4H PRN Chanetta Marshall, MD      albuterol  2.5 mg RT Q4H PRN Chanetta Marshall, MD      ascorbic acid (vitamin C)  500 mg Daily 0900 Chanetta Marshall, MD 500 mg at 07/19/22 1113    aspirin  81 mg Daily with breakfast Chanetta Marshall, MD 81 mg at 07/18/22 1024    atorvastatin  80 mg Nightly (2100) Chanetta Marshall, MD 80 mg at 07/19/22 2107    bisacodyL  10 mg Daily PRN Karie Mainland, MD 10 mg at 07/19/22 2107    budesonide-formoteroL  2 puff RT BID Chanetta Marshall, MD 2 puff at 07/19/22 2041    cyanocobalamin  1,000 mcg Daily 0900 Chanetta Marshall, MD 1,000 mcg at 07/19/22 1114    ED albuterol  2 puff RT Q4H PRN Chanetta Marshall, MD      ergocalciferol  50,000 Units Weekly Chanetta Marshall, MD 50,000 Units at 07/19/22 1113    ezetimibe  10 mg Nightly (2100) Karie Mainland, MD 10 mg at 07/19/22 2107    gabapentin  200 mg BID Chanetta Marshall, MD 200 mg at 07/19/22 2107    guaiFENesin  600 mg BID PRN Ruthe Mannan, MD 600 mg at 07/19/22 1114    heparin  5,000 Units 3 times per day Chanetta Marshall, MD 5,000 Units at 07/20/22 1610    lidocaine  1 patch Daily PRN Karie Mainland, MD 1 patch at 07/19/22 1115    [Held by provider] lisinopriL  20 mg Daily 0900 Chanetta Marshall, MD 20 mg at 07/17/22 9604     methocarbamoL  750 mg 4x Daily PRN Ruthe Mannan, MD 750 mg at 07/19/22 2107    metOLazone  5 mg Daily 0900 Chanetta Marshall, MD 5 mg at 07/19/22 1114    nicotine  1 patch Daily 0900 Chanetta Marshall, MD 1 patch at 07/19/22 1116    And    nicotine (polacrilex)  2 mg Q1H PRN  Chanetta Marshall, MD 2 mg at 07/13/22 0834    ondansetron  4 mg Q6H PRN Chanetta Marshall, MD      pantoprazole  40 mg QAM AC Chanetta Marshall, MD 40 mg at 07/20/22 4132    polyethylene glycol  17 g BID Laddie Aquas, MD 17 g at 07/19/22 2107    senna-docusate  1 tablet Nightly (2100) Laddie Aquas, MD 1 tablet at 07/19/22 2107    sertraline  75 mg Daily 0900 Laddie Aquas, MD 75 mg at 07/19/22 1114       Vital Signs and Intake/Output   All vital signs during the previous 24 hours reviewed.     Temp:  [97.7 F (36.5 C)-99.1 F (37.3 C)] 98.5 F (36.9 C)  Heart Rate:  [63-72] 66  Resp:  [16-18] 16  BP: (92-103)/(47-78) 103/56      Intake/Output Summary (Last 24 hours) at 07/20/2022 0750  Last data filed at 07/19/2022 2107  Gross per 24 hour   Intake 360 ml   Output --   Net 360 ml       Physical Exam     General Medical Exam  CONSTITUTIONAL: well-appearing adult women in no acute distress, appears stated age  EYES: no conjunctival injection or scleral icterus  CV: warm and well perfused, no significant peripheral edema  RESP: no accessory muscle use, no increased WOB on nasal cannula, frequent coughing productive for mucous  SKIN: warm, dry, intact with normal turgor, no appreciable lesions    Neurologic Exam  Mental Status:   Level of consciousness: alerts spontaneously and to voice  Orientation: oriented to person, place, day, situation  Attention/concentration: able to attend exam well  Language: normal fluency/word-finding in conversation, no notable paraphasic errors, able to follow multi-step commands.  Speech: clear, coherent, goal-directed, spontaneous, without notable dysarthria  No left-right confusion. No signs of neglect  on exam    Cranial Nerves:  CN II: Visual fields grossly intact, tracks across visual fields.  CN III/IV/VI: Gaze conjugate, EOMI with normal saccades/smooth pursuit, no nystagmus  CN V: Facial sensation to light touch intact in V1, V2; subjectively diminished on R relative to L in V3  CN VII: Facial motion and strength intact and symmetric   CN VIII: Hearing grossly intact to conversation  CN IX/X: Strong phonation, cough  CN XI: Sternocleidomastoid strong bilaterally  CN XII: Tongue protrudes midline    Motor:   Decreased bulk appropriate to age.  No rest tremor or adventitial movements observed.    No pronator drift.     Strength: Raises and holds antigravity for all four extremities, but difficulty raising RLE to 45 degrees and drifts down    Coordination:  No dysmetria on finger-to-nose. Able to perform finger tapping.    Reflexes: Not tested today    Sensory: Reports diminished sensation on RUE/RLE relative to the L    Gait and Station:  Station: normal truncal control, rises from bed without assist.  Gait: normal gait and base with standard walk.      Diagnostic Studies   All diagnostic studies over the past 24 hours were reviewed by me and the attending. Pertinent findings discussed below in the A&P.    Labs:        Lab 07/20/22  0535 07/19/22  0821 07/17/22  0614   SODIUM 135 135 137   POTASSIUM 3.7 4.3 4.0   CHLORIDE 98 97* 101   CO2 27 30 29    BUN 31* 36*  30*   CREATININE 0.81 1.04 0.96   GLUCOSE 99 107* 103*   MAGNESIUM 2.0 2.1 1.9   PHOSPHORUS 4.0 4.5 3.1         Lab 07/20/22  0535 07/19/22  0821 07/17/22  0614   WBC 5.4 5.7 4.2   HEMOGLOBIN 13.1 13.8 12.5   HEMATOCRIT 38.8 40.3 36.9   MEAN CORPUSCULAR VOLUME 87.0 86.9 87.5   PLATELETS 183 167 175       Radiology:    [facility] and CTA performed at OSH    MRI Brain (07/13/2022)    IMPRESSION:   1.  2 punctate foci of acute ischemia in the left basal ganglia measuring less than 1 mm each.       CT Chest (07/14/2022)  IMPRESSION:     Irregular spiculated nodule in  the right upper lobe, indeterminate but suspicious for primary malignancy. CT PET and/or tissue sampling may be considered. At minimum, comparison with any available outside imaging and close follow-up is recommended.     Limited number of additional smaller nodules are nonspecific and can be reassessed on follow-up.     Bibasilar areas of dependent consolidation associated with retained airway secretions, likely atelectasis or scar. No specific findings to suggest superimposed pneumonia.     PET Scan (07/16/2022)  IMPRESSION:   1. FDG avid cervical and thoracic lymphadenopathy, bilateral lung lesions, skeletal lesions, and soft tissue lesion in the lower abdomen/pelvis are suspicious for metastatic malignancy or less likely lymphoma.   2.  Asymmetric uptake in the right fossa of Rosenmuller is likely inflammatory, but correlation with exam is suggested.   3.  Focal uptake in the right nasopharynx is favored to represent physiologic uptake in the longest colli muscle, but reevaluation on follow-up is recommended. This could alternatively be evaluated with contrast-enhanced CT of the neck, if indicated.   4.  Asymmetric laryngeal uptake may reflect left vocal cord weakness.       Assessment and Plan     Melvin Marmo is a 77 y.o. 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 Today is Hospital Day 9.       #Acute L Basal Ganglia Ischemic Stroke  #Chronic Small Vessel Disease  #Bilateral Carotid bulb atherosclerosis, R ICA stenosis  Presented with R-sided hemiparesis and sensory loss. Reportedly had an NIHSS1 on arrival to OSH; [place] on arrival to [facility]. OSH CTH notable for several new, small, low-density areas within the pons / brainstem that could be acute or subacute infarcts vs artifact. Patient was outside window for TNK (LKW was 4/7). MRI significant for two punctate foci of acute  ischemia in the left basal ganglia (did not clearly show brainstem infarct). Potential etiologies include cardioembolic vs thromboembolic vs large vessel atherosclerosis vs chronic microvascular disease vs cryptogenic. Risk stratification labs: A1c 5.2%, LDL 112. TTE showed no shunt or vegetation. Deep nuclei location is consistent with chronic small vessel disease, likely related to poorly controlled HTN and significant smoking history.   - Continue [facility] tele  - Neurochecks Q4h  - Secondary stroke prevention: ASA 81 mg daily, Atorvastatin 80 mg QHS  - Will not discharge with an event monitor  - PT/OT/SLP - recommend IPR, working on placement and pre-cert  - Stroke Nurse consult    #COPD  #Tobacco Use Disorder  #Incidental RUL pulmonary nodule  Home regimen includes  Albuterol inhaler as-needed and Advair 1 puff BID. Does not use oxygen at baseline. CT neck from OSH noted incidental 2.2 cm pulmonary nodule in the right apex of the lung. Further investigated with CT Chest (4/16) that is concerning - irregular spiculated nodule in the right upper lobe, indeterminate but suspicious for primary malignancy. PET scan showed additional nodules in the cervical  lymph nodes. 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  - Monitor WOB, O2 requirement  - Continue home inhalers  - Nicotine gum PRN  - Consider smoking cessation counseling  - TODAY: plan for L supraclavicular biopsy with Pulm IR, will use local anesthesia as she is post-acute CVA     [Oncology Consult] We reviewed PET CT Finding with patient and discussed like Lung cancer since it is on both sides of Lung and Cervical LN it is Stage IV and not curable. Option would be palliative chemo/IO or combination and if any targets identified will be Oral. She lives hour away from [facility] for her long term cancer would be better at local oncologist but we can help to establish diagnosis and treatment plan while she is in area (hospital + Inpt  rehab). Will make an appointment for her to see Dr. Karen Chafe upon discharge.     [Interventional Pulmonology Consult] (updated 4/21) Concerning for metastatic disease, would suspect primary lung etiology. RUL nodule spiculated, measuring 2.7 x 1.9 x 2.6 cm, concerning for primary bronchogenic carcinoma. PET with mediastinal, hilar, and supraclavicular lymphadenopathy with uptake concerning for metastatic disease      #H/o Bladder cancer  Patient reported a h/o bladder cancer. Details unknown as she had care at an OSH, information not in Care Everywhere. 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    #CAD s/p PCI to LAD  #HTN  Patient instructed to take daily ASA 81 mg, but frequently forgets and not listed in home medications. ECG from OSH normal sinus rhythm without ischemic changes. Troponin in normal range. TTE: EF 55-60%, no LVH, no RWMA, no identified thrombus, no shunt  - Continue ASA and Atorvastatin  - Continue home Lisinopril 20 mg daily, Metolazone 5 mg daily    #HLD  Lipid Profile: Triglyc 122, HDL 46, LDL 112, T-Chol 182,   - Continue home Atorvastatin    #CKD Stage III  Baseline Cr unknown due to lack of medical records. However, documented CKD stage 3 from OSH records. Cr 0.75, BUN 15 on admission.   - Avoid nephrotoxic medications    #GERD  - Continue home Pantoprazole    #Neuropsych  - Continue home Sertraline, Gabapentin    #Back Pain, Chronic  Endorses progressive worsening of her multifocal back pain. While living in [place] she was prescribed muscle relaxers at home that improved her pain. We will trial Robaxin PRN while admitted and monitor for response.  - Continue Tylenol 650 mg Q6h PRN  - Start Robaxin 750 mg QID PRN for muscle spasms      Inpatient Checklist:    Diet:  Diet/Nutrition Orders    Diet Regular(7)     Frequency: Effective Now     Number of Occurrences: Until Specified     Order Questions:      Suicide/Behavior Risk Modification? No     IV  Fluids: None  Lines: PIV  Foley: No  Telemetry: Yes - stroke  Pain management: Acetaminophen, Robaxin PRN  DVT prophylaxis: SQH  GI prophylaxis: PPI  Bowel Regimen: Suppository  PRN  Precautions/Isolation: None  Ambulation: as tolerated  Ancillary services: PT/OT/SLP/SW  Code Status: Full Code  Dispo: Floor         PT Intervention Plan: Treatment/Interventions: LE strengthening/ROM, Therapeutic Activity, Therapeutic Exercise, Endurance training, Patient/family training, Equipment eval/education, Gait training, Neuromuscular Reeducation, Stair Training  PT Frequency: minimum 3x/week       PT recs: Recommendation: IP Rehab  Equipment Recommended: Defer to facility to obtain, Rolling walker       OT Interventions Plan: Treatment Interventions: Activity Tolerance training, ADL retraining, IADL retraining, Energy Conservation, Equipment eval/education, Cognitive reorientation, Neuro muscular reeducation, Functional transfer training, Fine motor coordination activities, Patient/Family training, UE strengthening/ROM, Therapeutic Activity, Compensatory technique education, Excercise  OT Frequency: minimum 3x/week       OT recs: Recommendation: IP Rehab  Equipment Recommendations: Defer at this time       SLP recs: Diagnosis: WFL      Karie Mainland, MD, PhD  [facility] Pediatric Neurology Resident, PGY-3  07/20/2022    I saw and examined the patient on 07/20/22, and discussed the case with Dr. Lucretia Field and Ladona Ridgel and agree with the  findings and plan as documented in their note.    Orlena Sheldon, MD

## 2022-07-20 NOTE — Care Coordination-Inpatient (Addendum)
[facility]  Case Management/Social Work Department  Progress Note    Patient Information     Patient Name: Michele Hopkins  MRN: [id number]  Hospital day: 9  Inpatient/Observation:  Inpatient   Level of Care:  Floor  Admit date:  07/11/2022  Admission diagnosis: possible stroke    PMH:  has a past medical history of CAD (coronary artery disease), Carotid arterial disease (CMS-HCC), Chronic pain syndrome, CKD (chronic kidney disease) stage 3, GFR 30-59 ml/min (CMS-HCC), CVA (cerebral vascular accident) (CMS-HCC), Depression, Dysphagia, GERD with esophagitis, History of bladder cancer, HLD (hyperlipidemia), HTN (hypertension), Idiopathic peripheral neuropathy, PAD (peripheral artery disease) (CMS-HCC), Solitary pulmonary nodule present on computed tomography of lung, TMJ syndrome, and Vitamin D deficiency.    PCP:  ATTENDING PROVIDER UNKNOWN    Home Pharmacy:    [facility]  [address]  [place] [address]  Phone: [phone]         Medical Insurance Coverage:  Payor: [id number] MANAGED MEDICARE / Plan: [employer] CHOICE PPO MEDICARE / Product Type: Medicare Mngd Care /     Other Pertinent Information     SW received an update from interdisciplinary team. Patient is not medically ready for discharge as she needs a lung biopsy.     1046: SW placed a call to [facility] with [facility], [phone] and left a voice message requesting a call back.     1048: SW placed a call to [facility]), [phone] and left a voice message requesting a call back.     1102: SW received a call back from [place] with [facility] who stated that they do not have access to [facility] and requested a hard fax.     1105: SW hard fax referral to facility at [phone]. SW will continue to follow.     1522: SW sent updated PT/OT notes to [facility] (fax: [phone]) and [facility] (fax: [phone]).     Discharge Plan     Anticipated discharge plan:   IPR     Anticipated discharge date:  4/26     CM/SW will continue to follow and remain available for discharge planning needs.      Candee Furbish, MSW, LSW  [phone]

## 2022-07-20 NOTE — Progress Notes (Signed)
Occupational Therapy  Treatment     Name: Michele Hopkins  DOB: 06-01-1945  Attending Physician: Aurea Graff, MD  Admission Diagnosis: possible stroke  Date: 07/20/2022  Room: 1610/R6045  Reviewed Pertinent hospital course: Yes   Hospital Course PT/OT: 77 y/o F p/w R sided weakness. OSH CT head: several new small low-density areas in brainstem, acute/subacute infarcts vs artifact. 4/15: MRI head significant for small acute infarct in the L basal ganglia 4/18: PET scan- significant uptake in the upper lobe nodule, mediastinum as well as a left supraclavicular lymph node. 4/22 IR: lymph node biopsy.  Relevant PMH : CVA (4098'J), CAD s/p PCI to LAD (2005), PAD, COPD, tobacco use, HLD, HTN, and CKD stage 3  Precautions: none  Activity Level: Activity as tolerated    Assist: None      Recommendation  Recommendation: IP Rehab  Equipment Recommendations: Defer at this time      Assessment  Assessment: Decreased IADLs, Decreased Functional Mobility, Decreased activity tolerance, Decreased ADL status, Decreased Balance, Decreased self-care transfers, Decreased fine motor control, Decreased UE strength          Prognosis for OT goals: Good                      Pt agreeable to participate in OT session on this date. Pt demonstrates improvements in functional mobility, completing a household distance with CGA using a RW. Pt continues to require Min-Mod A for ADLs and is functioning below baseline. Pt is limited by decreased balance, decreased strength, and decreased activity tolerance. Patient will continue to benefit from skilled acute OT services to increase functional independence.     Outcome Measures  AM-PAC 6 Clicks Daily Activity Inpatient Short Form: OT 6 Clicks Score: 17     Cognition  Overall Cognitive Status: Within Functional Limits  Arousal/Alertness: Alert  Orientation Level: Oriented X4  Behavior: Appropriate;Cooperative;Motivated  Following Commands: Follows all commands and directions without  difficulty  Safety Judgment: Good awareness of safety precautions  Insight: Demonstrated intact insight into limitation and abilities to complete ADL's safely  Communication: Verbalization    Pain  Pain Score: 4   Pain Location: Neck  Pain Descriptors: Aching  Pain Intervention(s): Ambulation/increased activity;Repositioned  Therapist reported pain to: RN aware    Functional Mobility  Bed Mobility  Supine to Sit:  (Pt seated in chair at start of session)  Sit to Supine: Minimal assistance;head of bed flat;towards the right  Functional Transfers  Sit to Stand: Minimal assistance;up to assistive device  Sit to Stand Assistive Device: Rolling walker  Stand to Sit: Minimal assistance;with assistive device  Stand to Sit Assistive Device: Rolling walker  Toilet Transfers: Minimal assistance;with assistive device  Toilet Transfers  Assistive Device: Rolling walker  Functional Mobility: Contact guard assistance;with assistive device (Pt completed a household distance in room to/from bathroom with CGA using a SPC)  Functional Mobility Assistive Device: Rolling walker  Balance  Sitting - Static: Supervision  Sitting - Dynamic: Stand by assistance  Standing - StaticContractor;With Assistive Device   Standing-Static Assistive Device: Rolling walker  Standing - DynamicContractor;With Assistive Device  Standing-Dynamic Assistive Device: Rolling walker    Gait belt used: Yes    ADL  Grooming: Stand-by assistance  Grooming Deficit: Set-up;Wash/dry face  Location Assessed Grooming: Standing at sink  Lower Body Dressing: Minimal assistance  Lower Body Dressing Deficit: Don/doff R sock;Don/doff L sock;Don/doff brief  Location Assessed LE Dressing: Seated edge of bed;Standing  edge of bed  Toileting: Moderate assistance  Toileting Deficit: Clothing management up;Clothing management down;Toileting hygiene  Location Assessed Toileting: Toilet       Position after Treatment/Safety Handoff  Position after  therapy session: Bed  Details: RN notified;Call light/ needs within reach  Alarms: Bed  Alarms Status: Activated and Interfaced with call system    Goals  Goals to be met in: one week  Patient stated goal: to go home  Patient will complete toilet transfer: Contact Guard assistance (goal met and upgraded 4/16)  Patient will complete toileting: Minimal assistance (goal met and upgraded 4/16)  Patient will complete lower body dressing: Contact Guard assistance (goal met & upgraded 4/22)  Pt Will participate in upper extremity HEP to prep for ADLs: RUE (goal met; continue 4/16)  Miscellaneous Goal #3: Pt will complete STS with CGA and LRAD in prep for OOB ADLs  Miscellaneous Goal #5: Pt will participate in functional cognition assessment  Long Term Goal : Pt will complete bathing assessment  Long term goal to be met in: two weeks  Collaborated with: Patient    Plan  Plan  Treatment Interventions: Activity Tolerance training, ADL retraining, IADL retraining, Energy Conservation, Equipment eval/education, Cognitive reorientation, Neuro muscular reeducation, Functional transfer training, Fine motor coordination activities, Patient/Family training, UE strengthening/ROM, Therapeutic Activity, Compensatory technique education, Excercise  OT Frequency: minimum 3x/week  The plan of care and recommendations assesses the patient's and/or caregiver's readiness, willingness, and ability to provide or support functional mobility and ADL tasks as needed upon discharge.    Patient/Family Education  Educated patient on the role of occupational therapy, discharge recommendation, ADL training, functional mobility training, and the importance of safety and fall prevention strategies including need for supervision/ assistance with OOB activity and use of call light. patient  verbalized understanding.    OT Time  Start Time: 1304  Stop Time: 1327  Time Calculation (min): 23 min    OT Charges     $Therapeutic Activity: 8-22 mins  $Self  Care/ADL/Home Management Training: 8-22 mins           Problem List  Patient Active Problem List   Diagnosis    Stroke (CMS-HCC)    COPD (chronic obstructive pulmonary disease) (CMS-HCC)    HTN (hypertension)    HLD (hyperlipidemia)    CKD (chronic kidney disease) stage 3, GFR 30-59 ml/min (CMS-HCC)     Past Medical History  Past Medical History:   Diagnosis Date    CAD (coronary artery disease)     Carotid arterial disease (CMS-HCC)     Chronic pain syndrome     CKD (chronic kidney disease) stage 3, GFR 30-59 ml/min (CMS-HCC)     CVA (cerebral vascular accident) (CMS-HCC)     Depression     Dysphagia     GERD with esophagitis     History of bladder cancer     HLD (hyperlipidemia)     HTN (hypertension)     Idiopathic peripheral neuropathy     PAD (peripheral artery disease) (CMS-HCC)     Solitary pulmonary nodule present on computed tomography of lung     TMJ syndrome     Vitamin D deficiency      Past Surgical History  Past Surgical History:   Procedure Laterality Date    APPENDECTOMY      CHOLECYSTECTOMY      COLONOSCOPY  06/26/2022    CORONARY ANGIOPLASTY WITH STENT PLACEMENT  2005    LAD    ESOPHAGOGASTRODUODENOSCOPY  06/26/2022    HERNIA REPAIR  02/2022    Left inguinal    HYSTERECTOMY      REPLACEMENT TOTAL KNEE Right

## 2022-07-21 LAB — CBC
Hematocrit: 35.8 % (ref 35.0–45.0)
Hemoglobin: 12.3 g/dL (ref 11.7–15.5)
MCH: 29.6 pg (ref 27.0–33.0)
MCHC: 34.4 g/dL (ref 32.0–36.0)
MCV: 86.2 fL (ref 80.0–100.0)
MPV: 9.1 fL (ref 7.5–11.5)
Platelets: 175 10*3/uL (ref 140–400)
RBC: 4.15 10*6/uL (ref 3.80–5.10)
RDW: 14 % (ref 11.0–15.0)
WBC: 5.6 10*3/uL (ref 3.8–10.8)

## 2022-07-21 MED FILL — GABAPENTIN 100 MG CAPSULE: 100 100 MG | ORAL | Qty: 2

## 2022-07-21 MED FILL — PANTOPRAZOLE 40 MG TABLET,DELAYED RELEASE: 40 40 MG | ORAL | Qty: 1

## 2022-07-21 MED FILL — LIDODERM 5 % TOPICAL PATCH: 5 5 % | TOPICAL | Qty: 1

## 2022-07-21 MED FILL — METHOCARBAMOL 500 MG TABLET: 500 500 MG | ORAL | Qty: 2

## 2022-07-21 MED FILL — SERTRALINE 50 MG TABLET: 50 50 MG | ORAL | Qty: 2

## 2022-07-21 MED FILL — METOLAZONE 5 MG TABLET: 5 5 MG | ORAL | Qty: 1

## 2022-07-21 MED FILL — STIMULANT LAXATIVE PLUS 8.6 MG-50 MG TABLET: 8.6-50 8.6-50 mg | ORAL | Qty: 1

## 2022-07-21 MED FILL — POLYETHYLENE GLYCOL 3350 17 GRAM ORAL POWDER PACKET: 17 17 gram | ORAL | Qty: 1

## 2022-07-21 MED FILL — TYLENOL 325 MG TABLET: 325 325 mg | ORAL | Qty: 2

## 2022-07-21 MED FILL — ATORVASTATIN 80 MG TABLET: 80 80 MG | ORAL | Qty: 1

## 2022-07-21 MED FILL — VITAMIN B-12  1,000 MCG TABLET: 1000 1000 MCG | ORAL | Qty: 1

## 2022-07-21 MED FILL — VITAMIN C 500 MG TABLET: 500 500 MG | ORAL | Qty: 1

## 2022-07-21 MED FILL — ASPIRIN 81 MG CHEWABLE TABLET: 81 81 MG | ORAL | Qty: 1

## 2022-07-21 MED FILL — HEPARIN (PORCINE) 5,000 UNIT/ML INJECTION SOLUTION: 5000 5,000 unit/mL | INTRAMUSCULAR | Qty: 1

## 2022-07-21 MED FILL — NICOTINE 14 MG/24 HR DAILY TRANSDERMAL PATCH: 14 14 mg/24 hr | TRANSDERMAL | Qty: 1

## 2022-07-21 MED FILL — EZETIMIBE 10 MG TABLET: 10 10 mg | ORAL | Qty: 1

## 2022-07-21 NOTE — Progress Notes (Signed)
[facility]   Medical Nutrition Therapy  Follow-Up    Diet Order/Nutrition Support: Regular (7)    Chief Complaint: Stroke     Pertinent Information: Michele Hopkins is a 77 y.o. female with stroke (1980's), CAD, PAD, HTN, HLD, CKD3, COPD, tobacco use disorder, ambulatory with walker at baseline presenting 07/11/2022 for trouble standing, leaning to the right. CTA with mild atherosclerosis of bilateral carotids. Incidental finding of  MRI brain with 2 punctate foci of acute stroke in L basal ganglia. TTE w/bubble EF 55-60%, no RWMA, no right-to-left shunt, no thrombus, L atrium normal. LDL 112, A1C 5.2%. PET scan with metastatic malignancy (unknown primary lesion). Oncology suspects stage IV lung cancer. Biopsy 07/20/2022 of L supraclavicular lymph node.     Documented PO intake of 100% of meals since 4/14 with 100% of meals today. Pt seen today at bedside. Endorses a good/fair appetite and states she is able to finish most of her meals but does feel full easily. Discussed Boost/Ensure if needed. Will continue to monitor. +BM 4/23    LOS: 10 days  Scheduled Meds:    ascorbic acid (vitamin C)  500 mg Oral Daily 0900    aspirin  81 mg Oral Daily with breakfast    atorvastatin  80 mg Oral Nightly (2100)    budesonide-formoteroL  2 puff Inhalation RT BID    cyanocobalamin  1,000 mcg Oral Daily 0900    ergocalciferol  50,000 Units Oral Weekly    ezetimibe  10 mg Oral Nightly (2100)    gabapentin  200 mg Oral BID    heparin  5,000 Units Subcutaneous 3 times per day    [Held by provider] lisinopriL  20 mg Oral Daily 0900    metOLazone  5 mg Oral Daily 0900    nicotine  1 patch Transdermal Daily 0900    pantoprazole  40 mg Oral QAM AC    polyethylene glycol  17 g Oral BID    senna-docusate  1 tablet Oral Nightly (2100)    sertraline  75 mg Oral Daily 0900      Continuous Infusions:   PRN Meds:acetaminophen **OR** acetaminophen, albuterol, bisacodyL, ED albuterol, guaiFENesin, lidocaine,  methocarbamoL, nicotine **AND** nicotine (polacrilex), ondansetron     Pertinent Labs:   Lab Results   Component Value Date    CREATININE 0.81 07/20/2022    BUN 31 (H) 07/20/2022    NA 135 07/20/2022    K 3.7 07/20/2022    CL 98 07/20/2022    CO2 27 07/20/2022     Lab Results   Component Value Date    ALBUMIN 3.6 07/20/2022     Lab Results   Component Value Date    CALCIUM 9.6 07/20/2022    PHOS 4.0 07/20/2022     Lab Results   Component Value Date    MG 2.0 07/20/2022     Lab Results   Component Value Date    POCGMD 95 07/16/2022     Lab Results   Component Value Date    HGBA1C 5.2 07/12/2022       Weight History:  Wt Readings from Last 5 Encounters:   07/11/22 140 lb (63.5 kg)   07/12/22 139 lb (63 kg)     Established Estimated Nutrition Needs:  Needs based On: CBW (64 kg)  Kcals/day: 1575-1890 (25-30 kcal/kg)  Protein g/day: 76-82 (1.2-1.3 g/kg)  Carbohydrate g/day: No hx of DM  Fluid ml/day: ~1 ml/kcal or per MD    Established  Nutrition Diagnosis  Nutrition Diagnosis: Increased nutrient needs (pro, kcal)  Related To: Complex medical hx   As Evidenced By: Increased metabolic demand    Established Nutrition Intervention: Monitor PO Intake/Tolerance   Established Goals: Total energy intake improved as evidenced by PO intake at least 75% of meals/supplements/snacks within 3-5 days   Goals: Met - ongoing    Discharge Planning and Education: Discharge orders for IPR. RD to remain available for coordination of care.     Follow up per nutrition services protocol while inpatient.    Additional Recommendation(s) to Physicians:   Continue to monitor PO intake, weight, nutrition-related labs, and POC    Clovis Fredrickson, RD, LD  Clinical Dietitian   Contact via Epic messenger

## 2022-07-21 NOTE — Progress Notes (Signed)
[facility]  Neurology Department     Fellow Attestation:     Discussed the case with the resident/medical student 07/20/22 and agree with the assessment and plan as documented in the note below.     Additional details:  Michele Hopkins is a 77 y.o. female with stroke (1980's), CAD, PAD, HTN, HLD, CKD3, COPD, tobacco use disorder, ambulatory with walker at baseline presenting 07/11/2022 for trouble standing, leaning to the right.     CTA head and neck with mild atherosclerosis of bilateral carotids, and incidental finding of spiculated lung mass. MRI brain with 2 punctate foci of acute stroke in L basal ganglia. TTE w/bubble EF 55-60%, no RWMA, no right-to-left shunt, no thrombus, L atrium normal. LDL 112, A1C 5.2%. PET scan with metastatic malignancy (primary unknown - biopsy pending) - Oncology suspects stage IV lung cancer.     Biopsy 07/20/2022 of L supraclavicular lymph node to determine primary malignancy.     Exam 07/20/22 alert and oriented x 3. Strength and sensation intact.     Etiology of strokes may be small vessel - suggestion of subacute and acute with symptoms going on for a couple weeks. Cannot rule out embolic or hypercoagulable with active malignancy.     Oncology suspects stage IV lung cancer, offering palliative chemotherapy. Biopsy of supraclavicular lymph node 07/20/22. Cardiac event monitor at discharge. ASA 81mg  daily. Atorvastatin 80mg  daily. IPR placement pending.     Myrtice Lauth, MD PGY5 Vascular Neurology Fellow     Parts of the attestation above have been copied from previous documentation by me for continuity of care. All parts have been reviewed and updated based on additional history, examination, and data from today to reflect current management of this patient on this date.     [facility]  Department of Neurology and Rehab Medicine  Inpatient Progress Note    Chief Complaint / Brief Hospital Course     Michele Hopkins is a 77  y.o. female with a PMH significant for remote CVA (1980's), CAD s/p PCI to LAD (2005), PAD, COPD, tobacco use, HLD, HTN, and CKD stage 3, who was transferred from [facility] due to concerns for stroke. Had a fall from the side of the bed on 4/8 without LOC; she reported feeling unsteady. After the fall, experienced R-sided weakness involving hand, arm, face. Symptoms began to improve so she did not seek medical evaluation. On 4/13, she sat down to drink coffee and was unable to stand up nor move the R side of her body, trouble speaking and swallowing. First evaluated at OSH with [facility] revealing several new small low-density areas within the brainstem. CTA neck dense atheromatous calcification bilateral carotid bulbs and right proximal ICA (less than 50% reduction of origin of the right ICA). MRI with two small acute infarcts in the L basal ganglia. Incidental lung nodule further assessed on CT Chest and PET with concern for malignancy.    Today is Hospital Day 10.    Interval History / Subjective     No acute events overnight, no new neurologic concerns. SBPs 90s-100s baseline; in 120s yesterday afternoon following procedure  Biopsy yesterday morning went without complication    Review of Systems (focused)     Constitutional: Negative for fever.   Respiratory: Negative for cough and shortness of breath.  Cardiovascular: Negative for chest pain, palpitations  Gastrointestinal: Negative for abdominal pain, N/V, diarrhea and constipation.  Neurological: Negative for new numbness, tingling, or  focal weakness    Medications   I personally reviewed all scheduled and PRN medications. All allergies were reviewed and updated.  See MAR for additional details.    Scheduled Medications:   ascorbic acid (vitamin C)  500 mg Oral Daily 0900    aspirin  81 mg Oral Daily with breakfast    atorvastatin  80 mg Oral Nightly (2100)    budesonide-formoteroL  2 puff Inhalation RT BID    cyanocobalamin  1,000 mcg Oral  Daily 0900    ergocalciferol  50,000 Units Oral Weekly    ezetimibe  10 mg Oral Nightly (2100)    gabapentin  200 mg Oral BID    heparin  5,000 Units Subcutaneous 3 times per day    [Held by provider] lisinopriL  20 mg Oral Daily 0900    metOLazone  5 mg Oral Daily 0900    nicotine  1 patch Transdermal Daily 0900    pantoprazole  40 mg Oral QAM AC    polyethylene glycol  17 g Oral BID    senna-docusate  1 tablet Oral Nightly (2100)    sertraline  75 mg Oral Daily 0900       Current Facility-Administered Medications   Medication Dose Frequency Provider Last Admin    acetaminophen  650 mg Q4H PRN Chanetta Marshall, MD 650 mg at 07/20/22 1820    Or    acetaminophen  650 mg Q4H PRN Chanetta Marshall, MD      albuterol  2.5 mg RT Q4H PRN Chanetta Marshall, MD      ascorbic acid (vitamin C)  500 mg Daily 0900 Chanetta Marshall, MD 500 mg at 07/20/22 1017    aspirin  81 mg Daily with breakfast Chanetta Marshall, MD 81 mg at 07/20/22 1022    atorvastatin  80 mg Nightly (2100) Chanetta Marshall, MD 80 mg at 07/20/22 2048    bisacodyL  10 mg Daily PRN Karie Mainland, MD 10 mg at 07/19/22 2107    budesonide-formoteroL  2 puff RT BID Chanetta Marshall, MD 2 puff at 07/20/22 2956    cyanocobalamin  1,000 mcg Daily 0900 Chanetta Marshall, MD 1,000 mcg at 07/20/22 1018    ED albuterol  2 puff RT Q4H PRN Chanetta Marshall, MD      ergocalciferol  50,000 Units Weekly Chanetta Marshall, MD 50,000 Units at 07/19/22 1113    ezetimibe  10 mg Nightly (2100) Karie Mainland, MD 10 mg at 07/20/22 2047    gabapentin  200 mg BID Chanetta Marshall, MD 200 mg at 07/20/22 2047    guaiFENesin  600 mg BID PRN Ruthe Mannan, MD 600 mg at 07/19/22 1114    heparin  5,000 Units 3 times per day Chanetta Marshall, MD 5,000 Units at 07/21/22 0612    lidocaine  1 patch Daily PRN Karie Mainland, MD 1 patch at 07/19/22 1115    [Held by provider] lisinopriL  20 mg Daily 0900 Chanetta Marshall, MD 20 mg at 07/17/22 2130    methocarbamoL  750 mg 4x Daily PRN Ruthe Mannan, MD 750 mg  at 07/20/22 1820    metOLazone  5 mg Daily 0900 Chanetta Marshall, MD 5 mg at 07/20/22 1017    nicotine  1 patch Daily 0900 Chanetta Marshall, MD 1 patch at 07/20/22 1017    And    nicotine (polacrilex)  2 mg Q1H PRN Chanetta Marshall, MD 2 mg at 07/13/22 0834    ondansetron  4 mg Q6H PRN Chanetta Marshall, MD  pantoprazole  40 mg QAM AC Chanetta Marshall, MD 40 mg at 07/21/22 1914    polyethylene glycol  17 g BID Laddie Aquas, MD 17 g at 07/20/22 2047    senna-docusate  1 tablet Nightly (2100) Laddie Aquas, MD 1 tablet at 07/20/22 2048    sertraline  75 mg Daily 0900 Laddie Aquas, MD 75 mg at 07/20/22 1017       Vital Signs and Intake/Output   All vital signs during the previous 24 hours reviewed.     Temp:  [97.9 F (36.6 C)-98.1 F (36.7 C)] 97.9 F (36.6 C)  Heart Rate:  [63-75] 63  Resp:  [14-16] 16  BP: (105-129)/(53-83) 105/53    No intake or output data in the 24 hours ending 07/21/22 0724      Physical Exam     General Medical Exam  CONSTITUTIONAL: well-appearing adult women in no acute distress, appears stated age  EYES: no conjunctival injection or scleral icterus  CV: warm and well perfused, no significant peripheral edema  RESP: no accessory muscle use, no increased WOB on nasal cannula, frequent coughing productive for mucous  SKIN: warm, dry, intact with normal turgor, no appreciable lesions    Neurologic Exam  Mental Status:   Level of consciousness: alerts spontaneously and to voice  Orientation: oriented to person, place, day, situation  Attention/concentration: able to attend exam well  Language: normal fluency/word-finding in conversation, no notable paraphasic errors, able to follow multi-step commands.  Speech: clear, coherent, goal-directed, spontaneous, without notable dysarthria  No left-right confusion. No signs of neglect on exam    Cranial Nerves:  CN II: Visual fields grossly intact, tracks across visual fields.  CN III/IV/VI: Gaze conjugate, EOMI with normal  saccades/smooth pursuit, no nystagmus  CN V: Facial sensation to light touch intact in V1, V2; subjectively diminished on R relative to L in V3  CN VII: Facial motion and strength intact and symmetric   CN VIII: Hearing grossly intact to conversation  CN IX/X: Strong phonation, cough  CN XI: Sternocleidomastoid strong bilaterally  CN XII: Tongue protrudes midline    Motor:   Decreased bulk appropriate to age.  No rest tremor or adventitial movements observed.    No pronator drift.     Strength: Raises and holds antigravity for all four extremities, but difficulty raising RLE to 45 degrees and drifts down    Coordination:  No dysmetria on finger-to-nose. Able to perform finger tapping.    Reflexes: Not tested today    Sensory: Reports diminished sensation on RUE/RLE relative to the L    Gait and Station:  Station: normal truncal control, rises from bed without assist.  Gait: normal gait and base with standard walk.      Diagnostic Studies   All diagnostic studies over the past 24 hours were reviewed by me and the attending. Pertinent findings discussed below in the A&P.    Labs:        Lab 07/20/22  0535 07/19/22  0821 07/17/22  0614   SODIUM 135 135 137   POTASSIUM 3.7 4.3 4.0   CHLORIDE 98 97* 101   CO2 27 30 29    BUN 31* 36* 30*   CREATININE 0.81 1.04 0.96   GLUCOSE 99 107* 103*   MAGNESIUM 2.0 2.1 1.9   PHOSPHORUS 4.0 4.5 3.1         Lab 07/20/22  0535 07/19/22  0821 07/17/22  0614   WBC 5.4 5.7 4.2  HEMOGLOBIN 13.1 13.8 12.5   HEMATOCRIT 38.8 40.3 36.9   MEAN CORPUSCULAR VOLUME 87.0 86.9 87.5   PLATELETS 183 167 175       Radiology:    [facility] and CTA performed at OSH    MRI Brain (07/13/2022)    IMPRESSION:   1.  2 punctate foci of acute ischemia in the left basal ganglia measuring less than 1 mm each.       CT Chest (07/14/2022)  IMPRESSION:     Irregular spiculated nodule in the right upper lobe, indeterminate but suspicious for primary malignancy. CT PET and/or tissue sampling may be considered. At minimum,  comparison with any available outside imaging and close follow-up is recommended.     Limited number of additional smaller nodules are nonspecific and can be reassessed on follow-up.     Bibasilar areas of dependent consolidation associated with retained airway secretions, likely atelectasis or scar. No specific findings to suggest superimposed pneumonia.     PET Scan (07/16/2022)  IMPRESSION:   1. FDG avid cervical and thoracic lymphadenopathy, bilateral lung lesions, skeletal lesions, and soft tissue lesion in the lower abdomen/pelvis are suspicious for metastatic malignancy or less likely lymphoma.   2.  Asymmetric uptake in the right fossa of Rosenmuller is likely inflammatory, but correlation with exam is suggested.   3.  Focal uptake in the right nasopharynx is favored to represent physiologic uptake in the longest colli muscle, but reevaluation on follow-up is recommended. This could alternatively be evaluated with contrast-enhanced CT of the neck, if indicated.   4.  Asymmetric laryngeal uptake may reflect left vocal cord weakness.       Assessment and Plan     Michele Hopkins is a 77 y.o. 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 Today is Hospital Day 10.       #Acute L Basal Ganglia Ischemic Stroke  #Chronic Small Vessel Disease  #Bilateral Carotid bulb atherosclerosis, R ICA stenosis  Presented with R-sided hemiparesis and sensory loss. Reportedly had an NIHSS1 on arrival to OSH; [place] on arrival to [facility]. OSH CTH notable for several new, small, low-density areas within the pons / brainstem that could be acute or subacute infarcts vs artifact. Patient was outside window for TNK (LKW was 4/7). MRI significant for two punctate foci of acute ischemia in the left basal ganglia (did not clearly show brainstem infarct). Potential etiologies include cardioembolic vs  thromboembolic vs large vessel atherosclerosis vs chronic microvascular disease vs cryptogenic. Risk stratification labs: A1c 5.2%, LDL 112. TTE showed no shunt or vegetation. Deep nuclei location is consistent with chronic small vessel disease, likely related to poorly controlled HTN and significant smoking history.   - Continue CMU tele  - Neurochecks Q4h  - Secondary stroke prevention: ASA 81 mg daily, Atorvastatin 80 mg QHS  - Will not discharge with an event monitor  - PT/OT/SLP - recommend IPR, working on placement  - Stroke Nurse consult    #COPD  #Tobacco Use Disorder  #Incidental RUL pulmonary nodule  Home regimen includes Albuterol inhaler as-needed and Advair 1 puff BID. Does not use oxygen at baseline. CT neck from OSH noted incidental 2.2 cm pulmonary nodule in the right apex of the lung. Further investigated with CT Chest (4/16) that is concerning - irregular spiculated nodule in the right upper lobe, indeterminate but suspicious for  primary malignancy. PET scan showed additional nodules in the cervical lymph nodes. 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 S/p L supraclavicular biopsy with pulmonary IR yesterday (4/22).  - Monitor WOB, O2 requirement  - Continue home inhalers  - Nicotine gum PRN  - Consider smoking cessation counseling  - Lung biopsy pending     #H/o Bladder cancer  Patient reported a h/o bladder cancer. Details unknown as she had care at an OSH, information not in Care Everywhere. 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    #CAD s/p PCI to LAD  #HTN  Patient instructed to take daily ASA 81 mg, but frequently forgets and not listed in home medications. ECG from OSH normal sinus rhythm without ischemic changes. Troponin in normal range. TTE: EF 55-60%, no LVH, no RWMA, no identified thrombus, no shunt  - Continue ASA and Atorvastatin  - Continue home Lisinopril 20 mg daily, Metolazone 5 mg  daily    #HLD  Lipid Profile: Triglyc 122, HDL 46, LDL 112, T-Chol 182,   - Continue home Atorvastatin    #CKD Stage III  Baseline Cr unknown due to lack of medical records. However, documented CKD stage 3 from OSH records. Cr 0.75, BUN 15 on admission.   - Avoid nephrotoxic medications    #GERD  - Continue home Pantoprazole    #Neuropsych  - Continue home Sertraline, Gabapentin    #Back Pain, Chronic  Endorses progressive worsening of her multifocal back pain. While living in [place] she was prescribed muscle relaxers at home that improved her pain. We will trial Robaxin PRN while admitted and monitor for response.  - Continue Tylenol 650 mg Q6h PRN  - Start Robaxin 750 mg QID PRN for muscle spasms      Inpatient Checklist:    Diet:  Diet/Nutrition Orders    Diet Regular(7)     Frequency: Effective Now     Number of Occurrences: Until Specified     Order Questions:      Suicide/Behavior Risk Modification? No     IV Fluids: None  Lines: PIV  Foley: No  Telemetry: Yes - stroke  Pain management: Acetaminophen, Robaxin PRN  DVT prophylaxis: SQH  GI prophylaxis: PPI  Bowel Regimen: Suppository PRN  Precautions/Isolation: None  Ambulation: as tolerated  Ancillary services: PT/OT/SLP/SW  Code Status: Full Code  Dispo: Floor         PT Intervention Plan: Treatment/Interventions: LE strengthening/ROM, Therapeutic Activity, Therapeutic Exercise, Endurance training, Patient/family training, Equipment eval/education, Gait training, Press photographer, Stair Training  PT Frequency: minimum 3x/week       PT recs: Recommendation: IP Rehab  Equipment Recommended: Defer to facility to obtain, Rolling walker       OT Interventions Plan: Treatment Interventions: Activity Tolerance training, ADL retraining, IADL retraining, Energy Conservation, Equipment eval/education, Cognitive reorientation, Neuro muscular reeducation, Functional transfer training, Fine motor coordination activities, Patient/Family training, UE  strengthening/ROM, Therapeutic Activity, Compensatory technique education, Excercise  OT Frequency: minimum 3x/week       OT recs: Recommendation: IP Rehab  Equipment Recommendations: Defer at this time       SLP recs: Diagnosis: WFL      Karie Mainland, MD, PhD  [facility] Pediatric Neurology Resident, PGY-3  07/21/2022    I saw and examined the patient on 07/21/22, and discussed the case with Dr. Lucretia Field and Ladona Ridgel and agree with the  findings and plan as documented in their note.  Orlena Sheldon, MD

## 2022-07-21 NOTE — Care Coordination-Inpatient (Signed)
[facility]  Case Management/Social Work Department  Progress Note    Patient Information     Patient Name: Michele Hopkins  MRN: [id number]  Hospital day: 10  Inpatient/Observation:  Inpatient   Level of Care:    Admit date:  07/11/2022  Admission diagnosis: possible stroke    PMH:  has a past medical history of CAD (coronary artery disease), Carotid arterial disease (CMS-HCC), Chronic pain syndrome, CKD (chronic kidney disease) stage 3, GFR 30-59 ml/min (CMS-HCC), CVA (cerebral vascular accident) (CMS-HCC), Depression, Dysphagia, GERD with esophagitis, History of bladder cancer, HLD (hyperlipidemia), HTN (hypertension), Idiopathic peripheral neuropathy, PAD (peripheral artery disease) (CMS-HCC), Solitary pulmonary nodule present on computed tomography of lung, TMJ syndrome, and Vitamin D deficiency.    PCP:  ATTENDING PROVIDER UNKNOWN    Home Pharmacy:    [facility]  [address]  [place] [address]  Phone: [phone]         Medical Insurance Coverage:  Payor: [phone] MANAGED MEDICARE / Plan: [employer] CHOICE PPO MEDICARE / Product Type: Medicare Mngd Care /     Other Pertinent Information     SW received report from neuro and completed chart review. Patient is medically ready to discharge. Accepted with [facility] IPR. Delice Bison @ [facility] is following [phone] and started pre cert today 5/28.     Delice Bison reports that pre cert is obtained. Plan for patient to discharge to IPR tomorrow 4/24. SW pre scheduled transportation with [facility] for [employer]. Team notified and will complete COC and DC summary. Admissions notified of transport time. RN notified and will update patient. No IMM needed, IPR.     Report [phone]  Fax [phone]    Discharge Plan     Anticipated discharge plan:  [facility] I[facility]     Anticipated discharge date:  4/24     CM/SW will continue to follow and remain available for discharge planning needs.      Chapman Moss, MSW     Cell [phone]

## 2022-07-21 NOTE — Plan of Care (Signed)
Problem: Safety  Goal: Patient will be injury free during hospitalization  Description: Assess and monitor vitals signs, neurological status including level of consciousness and orientation. Assess patient's risk for falls and implement fall prevention plan of care and interventions per hospital policy.      Ensure arm band on, uncluttered walking paths in room, adequate room lighting, call light and overbed table within reach, bed in low position, wheels locked, side rails up per policy, and non-skid footwear provided.   Outcome: Progressing  Goal: Patient with weight > 350lbs will have appropriate equipment  Description: Consider ordering Bariatric Bed, Chair and Bedside Commode for patient weight > 350 lbs.  Outcome: Progressing     Problem: Patient will remain free of falls  Goal: Universal Fall Precautions  Outcome: Progressing     Problem: Daily Care  Goal: Daily care needs are met  Description: Assess and monitor ability to perform self care and identify potential discharge needs.  Outcome: Progressing     Problem: Psychosocial Needs  Goal: Demonstrates ability to cope with hospitalization/illness  Description: Assess and monitor patients ability to cope with his/her illness.  Outcome: Progressing     Problem: Discharge Barriers  Goal: Patient's discharge needs are met  Description: Collaborate with interdisciplinary team and initiate plans and interventions as needed.   Outcome: Progressing     Problem: Patient will remain free of injury d/t fall  Goal: High Fall Risk Precautions  Outcome: Progressing  Goal: Additional High Fall Risk Precautions (consider the following)  Outcome: Progressing     Problem: Knowledge Deficit  Goal: Patient/family/caregiver demonstrates understanding of disease process, treatment plan, medications, and discharge instructions  Description: Complete learning assessment and assess knowledge base.  Outcome: Progressing     Problem: Potential for Compromised Skin Integrity  Goal:  Skin integrity is maintained or improved  Description: Assess and monitor skin integrity. Identify patients at risk for skin breakdown on admission and per policy. Collaborate with interdisciplinary team and initiate plans and interventions as needed.  Outcome: Progressing  Goal: Nutritional status is improving  Description: Monitor and assess patient for malnutrition (ex- brittle hair, bruises, dry skin, pale skin and conjunctiva, muscle wasting, smooth red tongue, and disorientation). Collaborate with interdisciplinary team and initiate plan and interventions as ordered.  Monitor patient's weight and dietary intake as ordered or per policy. Utilize nutrition screening tool and intervene per policy. Determine patient's food preferences and provide high-protein, high-caloric foods as appropriate.   Outcome: Progressing     Problem: Incontinence  Goal: Perineal skin integrity is maintained or improved  Description: Assess genitourinary system, perineal skin, labs (urinalysis), and history of incontinence to include past management, aggravating, and alleviating factors.  Collaborate with interdisciplinary team and initiate plans and interventions as needed.  Outcome: Progressing     Problem: Acute Pain  Description: Patient's pain progressing toward patient's stated pain goal  Goal: Patient displays improved well-being such as baseline levels for pulse, BP, respirations and relaxed muscle tone or body posture  Outcome: Progressing  Goal: Patient will manage pain with the appropriate technique/intervention  Description: Assess and monitor patient's pain using appropriate pain scale. Collaborate with interdisciplinary team and initiate plan and interventions as ordered.  Re-assess patient's pain level 30-60 minutes after pain management intervention.  Outcome: Progressing  Goal: Patient will reduce or eliminate use of analgesics  Outcome: Progressing  Goal: Patients pain is managed to allow active participation in daily  activities  Outcome: Progressing  Goal: Patient verbalizes a reduction in  pain level  Outcome: Progressing  Goal: Discharge Pain Management Plan (Acute Pain)  Outcome: Progressing     Problem: Chronic Pain  Description: Patient's pain progressing toward patient's stated pain goal  Goal: Pt will have limited adverse effects related to chronic pain  Description: i.e. Depression, opioid induced constipation, respiratory depression  Outcome: Progressing  Goal: Patient will manage pain with the appropriate technique/intervention  Description: Assess and monitor patient's pain using appropriate pain scale. Collaborate with interdisciplinary team and initiate plan and interventions as ordered.  Re-assess patient's pain level 30-60 minutes after pain management intervention.  Outcome: Progressing  Goal: Patient will reduce or eliminate use of analgesics  Outcome: Progressing  Goal: Patients pain is managed to allow active participation in daily activities  Outcome: Progressing  Goal: Patient verbalizes a reduction in pain level  Outcome: Progressing  Goal: Discharge Pain Management Plan (Chronic Pain)  Outcome: Progressing     Problem: Inadequate Gas Exchange  Goal: Patient is adequately oxygenated and ventilation is improved  Description: Assess and monitor vital signs, oxygen saturation, respiratory status to include rate, depth, effort, and lung sounds, mental status, cyanosis, and labs (ABG's).  Monitor effects of medications that may sedate the patient.  Collaborate with respiratory therapy to administer medications and treatments.  Outcome: Progressing

## 2022-07-22 LAB — RENAL FUNCTION PANEL W/EGFR
Albumin: 3.6 g/dL (ref 3.5–5.7)
Anion Gap: 7 mmol/L (ref 3–16)
BUN: 30 mg/dL — ABNORMAL HIGH (ref 7–25)
CO2: 31 mmol/L (ref 21–33)
Calcium: 9.6 mg/dL (ref 8.6–10.3)
Chloride: 97 mmol/L — ABNORMAL LOW (ref 98–110)
Creatinine: 0.83 mg/dL (ref 0.60–1.30)
EGFR: 72
Glucose: 105 mg/dL — ABNORMAL HIGH (ref 70–100)
Osmolality, Calculated: 287 mOsm/kg (ref 278–305)
Phosphorus: 3.9 mg/dL (ref 2.1–4.5)
Potassium: 3.3 mmol/L — ABNORMAL LOW (ref 3.5–5.3)
Sodium: 135 mmol/L (ref 133–146)

## 2022-07-22 LAB — UCMC GENOMIC SEQUENCING SOLID TUMOR PANEL

## 2022-07-22 MED ORDER — aspirin 81 MG chewable tablet
81 | Freq: Every day | ORAL | 1.00 refills | 30.00000 days | Status: AC
Start: 2022-07-22 — End: ?

## 2022-07-22 MED ORDER — nicotine, polacrilex, (NICORETTE) 2 mg gum
2 | BUCCAL | Status: AC | PRN
Start: 2022-07-22 — End: ?

## 2022-07-22 MED ORDER — nicotine (NICODERM CQ) 14 mg/24 hr
14 | Freq: Every day | TRANSDERMAL | Status: AC
Start: 2022-07-22 — End: ?

## 2022-07-22 MED ORDER — bisacodyL (DULCOLAX) 10 mg suppository
10 | Freq: Every day | RECTAL | Status: AC | PRN
Start: 2022-07-22 — End: ?

## 2022-07-22 MED ORDER — ezetimibe (ZETIA) 10 mg tablet
10 | Freq: Every evening | ORAL | Status: AC
Start: 2022-07-22 — End: ?

## 2022-07-22 MED FILL — HEPARIN (PORCINE) 5,000 UNIT/ML INJECTION SOLUTION: 5000 5,000 unit/mL | INTRAMUSCULAR | Qty: 1

## 2022-07-22 MED FILL — VITAMIN B-12  1,000 MCG TABLET: 1000 1000 MCG | ORAL | Qty: 1

## 2022-07-22 MED FILL — ASPIRIN 81 MG CHEWABLE TABLET: 81 81 MG | ORAL | Qty: 1

## 2022-07-22 MED FILL — POLYETHYLENE GLYCOL 3350 17 GRAM ORAL POWDER PACKET: 17 17 gram | ORAL | Qty: 1

## 2022-07-22 MED FILL — ATORVASTATIN 80 MG TABLET: 80 80 MG | ORAL | Qty: 1

## 2022-07-22 MED FILL — PANTOPRAZOLE 40 MG TABLET,DELAYED RELEASE: 40 40 MG | ORAL | Qty: 1

## 2022-07-22 MED FILL — TYLENOL 325 MG TABLET: 325 325 mg | ORAL | Qty: 2

## 2022-07-22 MED FILL — GABAPENTIN 100 MG CAPSULE: 100 100 MG | ORAL | Qty: 2

## 2022-07-22 MED FILL — EZETIMIBE 10 MG TABLET: 10 10 mg | ORAL | Qty: 1

## 2022-07-22 MED FILL — SERTRALINE 50 MG TABLET: 50 50 MG | ORAL | Qty: 2

## 2022-07-22 MED FILL — VITAMIN C 500 MG TABLET: 500 500 MG | ORAL | Qty: 1

## 2022-07-22 MED FILL — STIMULANT LAXATIVE PLUS 8.6 MG-50 MG TABLET: 8.6-50 8.6-50 mg | ORAL | Qty: 1

## 2022-07-22 NOTE — Other (Signed)
CONTINUITY OF CARE FORM     Patient name: Michele Hopkins  Patient MRN: [id number]  DOB: 1945/10/13  Age: 77 y.o.  Gender: female    Date of admission: 07/11/2022  Date of discharge: 07/22/2022    Attending provider: Aurea Graff, MD  Primary care physician: ATTENDING PROVIDER UNKNOWN    Code status: Full Code  Allergies:   Allergies   Allergen Reactions    Ana-Kit Bee Sting [Epinephrine-Chlorpheniramine] Anaphylaxis    Sulfa (Sulfonamide Antibiotics) Swelling    Adhesive Tape-Silicones Other (See Comments)     Skin comes off       Diagnoses Present on Admission   Primary Diagnosis: Stroke (CMS-HCC)  Discharge Diagnosis :   Active Hospital Problems    Diagnosis Date Noted    Stroke (CMS-HCC) [I63.9] 07/12/2022    COPD (chronic obstructive pulmonary disease) (CMS-HCC) [J44.9] 07/12/2022    HTN (hypertension) [I10] 07/12/2022    HLD (hyperlipidemia) [E78.5] 07/12/2022    CKD (chronic kidney disease) stage 3, GFR 30-59 ml/min (CMS-HCC) [N18.30] 07/12/2022      Resolved Hospital Problems   No resolved problems to display.     Prognosis: fair  Rehabilitation potential: fair    Diet     Diet/Nutrition Orders    Diet Regular(7)     Frequency: Effective Now     Number of Occurrences: Until Specified     Order Questions:      Suicide/Behavior Risk Modification? No     Dysphagia Assessment and Recommendations (when available): Prognosis: Excellent  Potential: Excellent  Dysphagia Diet Recommended (when available):      As listed above    Services Required   Skilled Nursing: Yes    PT Interventions and Frequency: Treatment/Interventions: LE strengthening/ROM, Therapeutic Activity, Therapeutic Exercise, Endurance training, Patient/family training, Equipment eval/education, Gait training, Neuromuscular Reeducation, Stair Training  PT Frequency: minimum 3x/week    PT Recommendations: Recommendation: IP Rehab  Equipment Recommended: Defer to facility to obtain, Rolling walker    OT Interventions and Frequency: Treatment  Interventions: Activity Tolerance training, ADL retraining, IADL retraining, Energy Conservation, Equipment eval/education, Cognitive reorientation, Neuro muscular reeducation, Functional transfer training, Fine motor coordination activities, Patient/Family training, UE strengthening/ROM, Therapeutic Activity, Compensatory technique education, Excercise  OT Frequency: minimum 3x/week    OT Recommendations: Recommendation: IP Rehab  Equipment Recommendations: Defer at this time    Weight bearing status:  full    Bedside Swallow Recommendations (when available):      Speech Language Recommendations (when available): Diagnosis: WFL    Needs 24 hour supervision due to cognitive impairment: No    Discharge Medications   Medications:     Medication List        ASK your doctor about these medications        Quantity/Refills   * albuterol 90 mcg/actuation inhaler  Commonly known as: PROVENTIL  Inhale 2 puffs into the lungs every 4 hours as needed for Wheezing.   Refills: 0     * albuterol 2.5 mg /3 mL (0.083 %) nebulizer solution  Commonly known as: PROVENTIL  Inhale 3 mLs (2.5 mg total) by nebulization every 4 hours as needed for Shortness of Breath or Wheezing.   Refills: 0     ascorbic acid (vitamin C) 500 MG tablet  Commonly known as: VITAMIN C  Take 1 tablet (500 mg total) by mouth daily.   Refills: 0     atorvastatin 80 MG tablet  Commonly known as: LIPITOR  Take 1 tablet (80 mg total) by mouth  daily.   Refills: 0     cyanocobalamin 1000 MCG tablet  Commonly known as: VITAMIN B-12  Take 1 tablet (1,000 mcg total) by mouth daily.   Refills: 0     ergocalciferol 1,250 mcg (50,000 unit) capsule  Commonly known as: ERGOCALCIFEROL  Take 1 capsule (50,000 Units total) by mouth once a week.   Refills: 0     fluticasone propion-salmeteroL 250-50 mcg/dose  Commonly known as: ADVAIR DISKUS/WIXELA INHUB  Inhale 1 puff into the lungs 2 times a day.   Refills: 0     gabapentin 100 MG capsule  Commonly known as: NEURONTIN  Take 2  capsules (200 mg total) by mouth in the morning and at bedtime.   Refills: 0     lisinopriL 40 MG tablet  Commonly known as: PRINIVIL  Take 1 tablet (40 mg total) by mouth daily.   Refills: 0     metOLazone 5 MG tablet  Commonly known as: ZAROXOLYN  Take 1 tablet (5 mg total) by mouth daily.   Refills: 0     pantoprazole 40 MG tablet  Commonly known as: PROTONIX  Take 1 tablet (40 mg total) by mouth every morning before breakfast.   Refills: 0     sertraline 25 MG tablet  Commonly known as: ZOLOFT  Take 1 tablet (25 mg total) by mouth daily.   Refills: 0           * This list has 2 medication(s) that are the same as other medications prescribed for you. Read the directions carefully, and ask your doctor or other care provider to review them with you.                        Discharge Specific Orders   Discharge specific orders:  None required    Isolation     Patient Isolation Status       None to display            Physician Certification of Medically Necessary Transportation   Type and reason for transportation: Stretcher - patient is non-ambulatory.  Reason for transport to another facility: Rehabilitation  Patient requires: na    Follow-up Appointments and [facility] Discharge Physician Name     Future Appointments   Date Time Provider Department Center   08/03/2022 10:00 AM Jarome Lamas, MD [facility]   10/15/2022 10:00 AM Shellia Cleverly, MD [facility]     Follow up with Hematology Oncology  Follow up with their PCP as scheduled.  Follow up with Dr. Mary Sella with Neurology    Physician Signature and Credentials   I certify that I have reviewed the information contained herein, and that the information is a true and accurate reflection of the individual's condition.    Discharging Physician: Electronically signed by Laddie Aquas, MD  07/22/2022, 11:15 AM    SOCIAL WORK DOCUMENTATION     Facility/Agency Name: Inpatient Rehabilitation Facility Name: [facility]    Number to call report:  Inpatient Rehabilitation Facility Provider Contact Number: Report: [phone], Fax: [phone]    Level of Care at Discharge:             Less than 30 day convalescent stay:      PASARR/HENS 7000 Completed:      Family Member Name and Relationship Notified at Discharge: [name], son    Family Contact Number: [phone]- left voice message.    Child psychotherapist or  Discharge Planner Name and Telephone Number: Norberto Sorenson, [phone]      NURSE DISCHARGE ASSESSMENT   Vitals:  Patient Vitals for the past 4 hrs:   BP Temp Temp src Pulse Resp SpO2   07/22/22 0933 -- -- -- 64 18 94 %   07/22/22 0756 96/51 98.4 F (36.9 C) Oral 60 18 93 %        Orientation:       Orientation Level: Oriented X4  Patient Behaviors: Calm, Cooperative    Respiratory:     Respiratory Pattern: Regular  Chest Assessment: Chest expansion symmetrical  Bilateral Breath Sounds: Clear, Diminished  R Breath Sounds: Clear, Diminished  L Breath Sounds: Clear, Diminished      Cardiac:  Cardiac (WDL): Within Defined Limits    Edema:  Peripheral Vascular (WDL): Within Defined Limits    Wounds:       Comfort/Mattress:       Musculoskeletal:  Musculoskeletal (WDL): Within Defined Limits  LUE: Full movement  RUE: Full movement  RLE: Full movement  LLE: Full movement    GI:  Gastrointestinal (WDL): Within Defined Limits  GI Symptoms: Constipation  Last BM Date: 07/21/22  Bowel Incontinence: No  Stool Source: Rectum    GU:  Genitourinary (WDL): Within Defined Limits  Genitourinary Symptoms: None  Urinary Incontinence: Yes  Urine Source: Urethra    Lines and Drains:  Patient Lines/Drains/Airways Status       Active Line / PIV Line       Name Placement date Placement time Site Days    Peripheral IV 07/14/22 Anterior;Left Forearm 07/14/22  2245  Forearm  7                    ADL's:  Level of Assistance: Minimal assist, patient does 75% or more  Feeding: Able to feed self  Level of Assistance: Minimal assist    Morse Fall Risk  Morse Fall Risk Score: (!)  75    Restraints:       RN to RN Handoff:  Nurse Giving Report:: Irving Burton  Reason for Handoff:: Shift Change        Nurse and Credentials   RN Handoff Completed by:   on 07/22/2022

## 2022-07-22 NOTE — Discharge Summary (Signed)
 [facility]  Department of Neurology & Rehab Medicine  Inpatient Neurology Service Discharge Summary  Team 1      Patient: Michele Hopkins   MRN: [id number] CSN: [id number]  Date of Admission: 07/11/2022  Chief Complaint: No chief complaint on file.    Date of Discharge: 07/22/2022  Attending Physician: Aurea Graff, MD   Senior: Laddie Aquas    Diagnoses Present on Admission     Past Medical History:   Diagnosis Date    CAD (coronary artery disease)     Carotid arterial disease (CMS-HCC)     Chronic pain syndrome     CKD (chronic kidney disease) stage 3, GFR 30-59 ml/min (CMS-HCC)     CVA (cerebral vascular accident) (CMS-HCC)     Depression     Dysphagia     GERD with esophagitis     History of bladder cancer     HLD (hyperlipidemia)     HTN (hypertension)     Idiopathic peripheral neuropathy     PAD (peripheral artery disease) (CMS-HCC)     Solitary pulmonary nodule present on computed tomography of lung     TMJ syndrome     Vitamin D deficiency         Discharge Diagnoses     Active Hospital Problems    Diagnosis Date Noted    Stroke (CMS-HCC) [I63.9] 07/12/2022    COPD (chronic obstructive pulmonary disease) (CMS-HCC) [J44.9] 07/12/2022    HTN (hypertension) [I10] 07/12/2022    HLD (hyperlipidemia) [E78.5] 07/12/2022    CKD (chronic kidney disease) stage 3, GFR 30-59 ml/min (CMS-HCC) [N18.30] 07/12/2022      Resolved Hospital Problems   No resolved problems to display.       Operations/Procedures Performed     Surgeries/Procedures:   Left Supraclavicular lymph node Biopsy (07/20/2022)    Lines/Drains/Airways:  Patient Lines/Drains/Airways Status       Active Line / PIV Line       Name Placement date Placement time Site Days    Peripheral IV 07/14/22 Anterior;Left Forearm 07/14/22  2245  Forearm  7                    Notable Imaging     X-ray Portable Chest   Final Result   IMPRESSION:    No evidence of pneumothorax.      Report Verified by: Micheal Likens, DO at 07/20/2022 11:05 AM  EDT      NM PET CT imaging Skull to Thigh   Final Result   IMPRESSION:   1.  FDG avid cervical and thoracic lymphadenopathy, bilateral lung lesions, skeletal lesions, and soft tissue lesion in the lower abdomen/pelvis are suspicious for metastatic malignancy or less likely lymphoma.   2.  Asymmetric uptake in the right fossa of Rosenmuller is likely inflammatory, but correlation with exam is suggested.   3.  Focal uptake in the right nasopharynx is favored to represent physiologic uptake in the longest colli muscle, but reevaluation on follow-up is recommended. This could alternatively be evaluated with contrast-enhanced CT of the neck, if indicated.   4.  Asymmetric laryngeal uptake may reflect left vocal cord weakness.      Report Verified by: Jaye Beagle, MD at 07/16/2022 7:02 PM EDT      CT Chest WO contrast   Final Result   IMPRESSION:      Irregular spiculated nodule in the right upper lobe, indeterminate but suspicious for primary malignancy. CT PET and/or  tissue sampling may be considered. At minimum, comparison with any available outside imaging and close follow-up is recommended.      Limited number of additional smaller nodules are nonspecific and can be reassessed on follow-up.      Bibasilar areas of dependent consolidation associated with retained airway secretions, likely atelectasis or scar. No specific findings to suggest superimposed pneumonia.      Approved by Raynelle Highland, MD on 07/14/2022 4:02 PM EDT      I have personally reviewed the images and I agree with this report.      Report Verified by: Leretha Pol, MD at 07/14/2022 4:19 PM EDT      MRI Head WO contrast   Final Result   IMPRESSION:       1.  2 punctate foci of acute ischemia in the left basal ganglia measuring less than 1 mm each.            Report Verified by: Leda Roys, MD at 07/13/2022 11:16 PM EDT      X-ray Comparison Images   Final Result            Data: Laboratory, Imaging, Electrographic Studies         Lab  07/22/22  0720 07/20/22  0535 07/19/22  0821 07/17/22  0614   SODIUM 135 135 135 137   POTASSIUM 3.3* 3.7 4.3 4.0   CHLORIDE 97* 98 97* 101   CO2 31 27 30 29    BUN 30* 31* 36* 30*   CREATININE 0.83 0.81 1.04 0.96   GLUCOSE 105* 99 107* 103*   MAGNESIUM  --  2.0 2.1 1.9   PHOSPHORUS 3.9 4.0 4.5 3.1         Lab 07/21/22  0745 07/20/22  0535 07/19/22  0821 07/17/22  0614   WBC 5.6 5.4 5.7 4.2   HEMOGLOBIN 12.3 13.1 13.8 12.5   HEMATOCRIT 35.8 38.8 40.3 36.9   MEAN CORPUSCULAR VOLUME 86.2 87.0 86.9 87.5   PLATELETS 175 183 167 175     Lab Results   Component Value Date    CHOLTOT 182 07/12/2022    TRIG 122 07/12/2022    HDL 46 (L) 07/12/2022    LDL 112 07/12/2022     Lab Results   Component Value Date    HGBA1C 5.2 07/12/2022     No results found for: FOLATE, VITAMINB12  No results found for: TSH, T3FREE, FREET4    Consulting Services     CONSULT STROKE NURSE  IP CONSULT TO SOCIAL WORK  IP CONSULT TO UGPIV  IP CONSULT TO ONCOLOGY    Allergies     Ana-Kit Bee Sting [Epinephrine-Chlorpheniramine]  Sulfa (Sulfonamide Antibiotics)  Adhesive Tape-Silicones    Discharge Medications        Medication List        TAKE these medications, which are NEW        Quantity/Refills   aspirin 81 MG chewable tablet  Chew 1 tablet (81 mg total) by mouth daily with breakfast.  Start taking on: July 23, 2022   Refills: 0     bisacodyL 10 mg suppository  Commonly known as: DULCOLAX  Place 1 suppository (10 mg total) rectally daily as needed.   Refills: 0     ezetimibe 10 mg tablet  Commonly known as: ZETIA  Take 1 tablet (10 mg total) by mouth at bedtime.   Refills: 0     nicotine (polacrilex) 2 mg gum  Commonly known as:  NICORETTE  Take 1 each (2 mg total) by mouth every hour as needed for Smoking cessation.   Refills: 0     nicotine 14 mg/24 hr  Commonly known as: NICODERM CQ  Place 1 patch onto the skin daily.  Start taking on: July 23, 2022   Refills: 0            TAKE these medications, which you were ALREADY TAKING         Quantity/Refills   * albuterol 90 mcg/actuation inhaler  Commonly known as: PROVENTIL  Inhale 2 puffs into the lungs every 4 hours as needed for Wheezing.   Refills: 0     * albuterol 2.5 mg /3 mL (0.083 %) nebulizer solution  Commonly known as: PROVENTIL  Inhale 3 mLs (2.5 mg total) by nebulization every 4 hours as needed for Shortness of Breath or Wheezing.   Refills: 0     ascorbic acid (vitamin C) 500 MG tablet  Commonly known as: VITAMIN C  Take 1 tablet (500 mg total) by mouth daily.   Refills: 0     atorvastatin 80 MG tablet  Commonly known as: LIPITOR  Take 1 tablet (80 mg total) by mouth daily.   Refills: 0     cyanocobalamin 1000 MCG tablet  Commonly known as: VITAMIN B-12  Take 1 tablet (1,000 mcg total) by mouth daily.   Refills: 0     ergocalciferol 1,250 mcg (50,000 unit) capsule  Commonly known as: ERGOCALCIFEROL  Take 1 capsule (50,000 Units total) by mouth once a week.   Refills: 0     fluticasone propion-salmeteroL 250-50 mcg/dose  Commonly known as: ADVAIR DISKUS/WIXELA INHUB  Inhale 1 puff into the lungs 2 times a day.   Refills: 0     gabapentin 100 MG capsule  Commonly known as: NEURONTIN  Take 2 capsules (200 mg total) by mouth in the morning and at bedtime.   Refills: 0     lisinopriL 40 MG tablet  Commonly known as: PRINIVIL  Take 1 tablet (40 mg total) by mouth daily.   Refills: 0     metOLazone 5 MG tablet  Commonly known as: ZAROXOLYN  Take 1 tablet (5 mg total) by mouth daily.   Refills: 0     pantoprazole 40 MG tablet  Commonly known as: PROTONIX  Take 1 tablet (40 mg total) by mouth every morning before breakfast.   Refills: 0     sertraline 25 MG tablet  Commonly known as: ZOLOFT  Take 1 tablet (25 mg total) by mouth daily.   Refills: 0           * This list has 2 medication(s) that are the same as other medications prescribed for you. Read the directions carefully, and ask your doctor or other care provider to review them with you.                   Where to Get Your Medications         Information about where to get these medications is not yet available    Ask your nurse or doctor about these medications  aspirin 81 MG chewable tablet  bisacodyL 10 mg suppository  ezetimibe 10 mg tablet  nicotine (polacrilex) 2 mg gum  nicotine 14 mg/24 hr         Reason for Admission     Michele Hopkins is a 77 y.o. 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 Pt was admitted to telemetry bed for further management.    Hospital Course, Discussion      Active Hospital Problems    Diagnosis Date Noted    Stroke (CMS-HCC) [I63.9] 07/12/2022    COPD (chronic obstructive pulmonary disease) (CMS-HCC) [J44.9] 07/12/2022    HTN (hypertension) [I10] 07/12/2022    HLD (hyperlipidemia) [E78.5] 07/12/2022    CKD (chronic kidney disease) stage 3, GFR 30-59 ml/min (CMS-HCC) [N18.30] 07/12/2022      Resolved Hospital Problems   No resolved problems to display.        #Acute L Basal Ganglia Ischemic Stroke  #Chronic Small Vessel Disease  #Bilateral Carotid bulb atherosclerosis, R ICA stenosis  Presented with R-sided hemiparesis and sensory loss that began on 4/13. Reportedly had an NIHSS1 on arrival to OSH; [place] on arrival to [facility]. OSH CTH notable for several new, small, low-density areas within the pons / brainstem that could be acute or subacute infarcts vs artifact. Patient was outside window for TNK (LKW was 4/7). MRI at [facility] was significant for two punctate foci of acute ischemia in the left basal ganglia (did not clearly show brainstem infarct). Potential etiologies include cardioembolic vs thromboembolic vs large vessel atherosclerosis vs chronic microvascular disease vs cryptogenic. Risk stratification labs: A1c 5.2%, LDL 112. TTE showed no shunt or vegetation. Deep nuclei location is consistent  with chronic small vessel disease, likely related to poorly controlled HTN and significant smoking history. Was also found to have presumed metastatic Lung cancer (biopsy pending). Is on ASA 81 mg daily, Atorvastatin 80 mg QHS for secondary stroke prevention. PT/OT evaluated and recommended IPR. Of note, the Brain MRI also showed diffuse chronic white matter changes throughout the corona radiata thought to be reflect multi-decade smoking history.    Etiology: Likely small vessel disease with some hypercoagulability component from malignancy. Cannot rule out cardioembolic.    Workup:  CT: CTH notable for several new, small, low-density areas within the pons / brainstem   CTA: CTA with mild atherosclerosis of bilateral carotids.   MRI Brain: 2 punctate foci of acute ischemia in the left basal ganglia measuring less than 1 mm each.   TTE: EF 55-60%, G1DD, no shunt  Lipid Panel: LDL   Lab Results   Component Value Date    LDL 112 07/12/2022     / HDL   Lab Results   Component Value Date    HDL 46 (L) 07/12/2022    / Total Cholesterol   Lab Results   Component Value Date    CHOLTOT 182 07/12/2022   {CHOLTOT / Triglycerides   Lab Results   Component Value Date    TRIG 122 07/12/2022      TSH: No results found for: TSH    Interventions Received:  No intervention as patient presented to the hospital outside the temporal window     Therapy Recommendations:  PT/OT: IPR    Secondary Stroke Prevention:  - ASA 81 mg daily   - Atorvastatin 80 mg daily   - Recommend goal BP < 130 / 80   - Recommend appropriate glucose control via PCP with goal A1c < 7.0   - High suspicion of cardioembolic source without history of atrial fibrillation. Will discharge with Preventice patch.  - Counseled patient on the importance of tobacco  and alcohol cessation   - Given deficits, patient should not drive until follow-up with Stroke neurologist.     Follow-up Tests Needed on Discharge:   - Cardiac monitoring data     #COPD  #Tobacco Use  Disorder  #Incidental RUL pulmonary nodule  Home regimen of Albuterol inhaler as-needed and Advair 1 puff BID. Does not use oxygen at baseline. CT/CTA Neck from OSH had noted an incidental 2.2 cm pulmonary nodule in the right apex of the lung. Further investigated with CT Chest (4/16) that was concerning for malignancy - irregular spiculated nodule in the right upper lobe, indeterminate but suspicious for primary malignancy. Obtained a PET scan (4/18) that showed additional nodules in the cervical lymph nodes and abd/pelvis, as well as bone lesions. 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 Completed L supraclavicular biopsy with pulmonary IR yesterday (4/22). Has follow up with hematology as outpatient     #H/o Bladder cancer  Patient reported a h/o bladder cancer. Details unknown as she had care at an OSH, information not in Care Everywhere. 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     #CAD s/p PCI to LAD  #HTN  Patient said she had been prescribed daily ASA 81 mg but that she frequently forgets (not listed in home medications). ECG from OSH normal sinus rhythm without ischemic changes. Troponin in normal range. TTE: EF 55-60%, no LVH, no RWMA, no identified thrombus, no shunt. Continued ASA and Atorvastatin as well as home antihypertensives: Lisinopril 20 mg daily, Metolazone 5 mg daily.     #HLD  Continued home Atorvastatin 80 mg QHS. Lipid Profile: Triglyc 122, HDL 46, LDL 112, T-Chol 182.     #CKD Stage III  Baseline Cr unknown due to lack of medical records. However, documented CKD stage 3 from OSH records. Cr 0.75, BUN 15 on admission. No significant renal concerns during admission.     #GERD  Continued home Pantoprazole 40 mg daily     #Neuropsych  Continued home Sertraline 75 mg daily, Gabapentin 200 mg BID      Discharge Physical Examination     Vital  Signs:  Temp:  [98.4 F (36.9 C)-99.1 F (37.3 C)] 98.4 F (36.9 C)  Heart Rate:  [60-88] 64  Resp:  [17-18] 18  BP: (96-110)/(51-56) 96/51    Intake/Output Summary (Last 24 hours) at 07/22/2022 1159  Last data filed at 07/22/2022 0402  Gross per 24 hour   Intake 1004 ml   Output --   Net 1004 ml       General Medical Exam  CONSTITUTIONAL: well-appearing adult women in no acute distress, appears stated age  EYES: no conjunctival injection or scleral icterus  CV: warm and well perfused, no significant peripheral edema  RESP: no accessory muscle use, no increased WOB on nasal cannula, frequent coughing productive for mucous  SKIN: warm, dry, intact with normal turgor, no appreciable lesions     Neurologic Exam  Mental Status:   Level of consciousness: alerts spontaneously and to voice  Orientation: oriented to person, place, day, situation  Attention/concentration: able to attend exam well  Language: normal fluency/word-finding in conversation, no notable paraphasic errors, able to follow multi-step commands.  Speech: clear, coherent, goal-directed, spontaneous, without notable dysarthria  No left-right confusion. No signs of neglect on exam     Cranial Nerves:  CN II: Visual fields grossly intact, tracks across visual  fields.  CN III/IV/VI: Gaze conjugate, EOMI with normal saccades/smooth pursuit, no nystagmus  CN V: Facial sensation to light touch intact in V1, V2; subjectively diminished on R relative to L in V3  CN VII: Facial motion and strength intact and symmetric   CN VIII: Hearing grossly intact to conversation  CN IX/X: Strong phonation, cough  CN XI: Sternocleidomastoid strong bilaterally  CN XII: Tongue protrudes midline     Motor:   Decreased bulk appropriate to age.  No rest tremor or adventitial movements observed.    No pronator drift.      Strength: Raises and holds antigravity for all four extremities, but difficulty raising RLE to 45 degrees and drifts down.     Coordination:  No dysmetria on  finger-to-nose. Able to perform finger tapping.     Reflexes: Not tested today     Sensory: Reports diminished sensation on RUE/RLE relative to the L     Gait and Station:  Station: normal truncal control, rises from bed without assist.  Gait: normal gait and base with standard walk.      Condition on Discharge     1. Functional Status: mildly impaired   Describe limitations, if any: weak but ambulatory   PT Interventions and Frequency: Treatment/Interventions: LE strengthening/ROM, Therapeutic Activity, Therapeutic Exercise, Endurance training, Patient/family training, Equipment eval/education, Gait training, Press photographer, Stair Training  PT Frequency: minimum 3x/week   PT Recommendations: Recommendation: IP Rehab  Equipment Recommended: Defer to facility to obtain, Rolling walker   OT Interventions and Frequency: Treatment Interventions: Activity Tolerance training, ADL retraining, IADL retraining, Energy Conservation, Equipment eval/education, Cognitive reorientation, Neuro muscular reeducation, Functional transfer training, Fine motor coordination activities, Patient/Family training, UE strengthening/ROM, Therapeutic Activity, Compensatory technique education, Excercise  OT Frequency: minimum 3x/week   OT Recommendations: Recommendation: IP Rehab  Equipment Recommendations: Defer at this time  2. Mental Status: Alert/Oriented   Describe limitations, if any: N/A  3. Dietary Restrictions / Tube Feeding / TPN: No   If yes, describe: N/A   Speech Frequency and Recommendations: SLP Treatment Details: R/T dc  4. Supplemental Oxygen / Ventilation / CPAP / Bi-Level: No   If yes, describe settings/delivery: N/A   Change from baseline?: N/A  5. In-dwelling Lines or Catheters: No   If yes, describe: N/A    Disposition     Inpatient Rehab    Follow-Up Appointments     Future Appointments   Date Time Provider Department Center   08/03/2022 10:00 AM Jarome Lamas, MD [facility]   10/15/2022 10:00 AM Shellia Cleverly, MD [facility] NEUR [facility]       Specific Follow-Up Items for Receiving Physician      FOR FOLLOW-UP NEUROLOGIST:  Labs Pending on discharge: None     FOR FOLLOW-UP PCP:  Smoking cessation supports  Ensure follow-ups in place for presumed Lung Cancer identified during admission (biopsy pending)      Signature  Karie Mainland, MD, PhD  [facility] Pediatric Neurology Resident, PGY-3  07/22/2022      I saw and examined the patient on 07/22/22, and discussed the case with Dr. Lily Kocher and Ladona Ridgel and agree with the  findings and plan as documented in their note.  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   Will need to contact patient tomorrow.     Orlena Sheldon, MD

## 2022-07-22 NOTE — Care Coordination-Inpatient (Addendum)
[place]  Case Management/Social Work Department  Progress Note    Patient Information     Patient Name: Michele Hopkins  MRN: [id number]  Hospital day: 10  Inpatient/Observation:  Inpatient   Level of Care:  Floor  Admit date:  07/11/2022  Admission diagnosis: possible stroke    PMH:  has a past medical history of CAD (coronary artery disease), Carotid arterial disease (CMS-HCC), Chronic pain syndrome, CKD (chronic kidney disease) stage 3, GFR 30-59 ml/min (CMS-HCC), CVA (cerebral vascular accident) (CMS-HCC), Depression, Dysphagia, GERD with esophagitis, History of bladder cancer, HLD (hyperlipidemia), HTN (hypertension), Idiopathic peripheral neuropathy, PAD (peripheral artery disease) (CMS-HCC), Solitary pulmonary nodule present on computed tomography of lung, TMJ syndrome, and Vitamin D deficiency.    PCP:  ATTENDING PROVIDER UNKNOWN    Home Pharmacy:    [facility]  [address]  [place] [address]  Phone: [phone]         Medical Insurance Coverage:  Payor: [phone] MANAGED MEDICARE / Plan: [employer] CHOICE PPO MEDICARE / Product Type: Medicare Mngd Care /     Other Pertinent Information     SW received an update from interdisciplinary team. Patient is medically ready for discharge.     1324: SW verified transportation with [employer], [phone]. Transport is arranged for 1300 pick up from [facility] and transporting to [employer].     0932: SW placed a call to Delice Bison with [employer], [phone] that they are still able to accept today. SW informed her transport is still confirmed for 1300 pick up form [facility]. SW verified report and fax number.     Report [phone]  Fax [phone]    Discharge Plan     Anticipated discharge plan:  IPR     Anticipated discharge date:  4/24     CM/SW will continue to follow and remain available for discharge planning needs.      Candee Furbish, MSW, LSW  [phone]

## 2022-07-22 NOTE — Other (Signed)
[place]    Case Manager/Social Worker Discharge Summary     Patient name: Michele Hopkins                                        Patient MRN: [id number]  DOB: 1945-11-09                              Age: 77 y.o.              Gender: female  Patient emergency contact: Extended Emergency Contact Information  Primary Emergency Contact: Allred,Nancy  Mobile Phone: [phone]  Relation: Friend  Secondary Emergency Contact: Short,Rodney  Mobile Phone: [phone]  Relation: Son      Attending provider: Aurea Graff, MD  Primary care physician: ATTENDING PROVIDER UNKNOWN    The MD has indicated that the patient is ready for discharge.  Esti Demello was referred and accepted at Inpatient Rehabilitation Facility Name: [facility] Provider Contact Number: Report: [phone], Fax: [phone].  The patient will be transported by [employer] [phone] at 1300    Transfer Mode/Level of Care: Basic Life Support Stretcher (BLS)    DC Summary and COC have been faxed to facility.     The plan has been reviewed:     Patient/Family Informed of Discharge Plan: Yes  Plan Reviewed With Patient, Family, or Significant Other: Yes    Patient and or family are aware and in agreement with the discharge plan: Yes    Family Member Name and Relationship Notified at Discharge: [name], son  Family Contact Number: [phone]- left voice message.  Plan reviewed with MD and other members of the health care team: Yes  Care Plan Completed: Yes    No further CM/SW needs.    This plan has been reviewed with the multi-disciplinary team.     Treatment Preferences    Treatment Preferences: Provider Preference    Post-Discharge Goals    Patient's Post-Discharge goals: Retrun home    Post Acute Care Provider Information:  Inpatient Rehabilitation Facility Name: [facility]   Inpatient Rehabilitation Facility Provider Contact Number: Report: [phone], Fax: [phone]    Community  Services at [employer] at Home post discharge: Not Applicable    Candee Furbish, MSW, LSW  [phone]

## 2022-07-22 NOTE — Plan of Care (Signed)
Problem: Safety  Goal: Patient will be injury free during hospitalization  Description: Assess and monitor vitals signs, neurological status including level of consciousness and orientation. Assess patient's risk for falls and implement fall prevention plan of care and interventions per hospital policy.      Ensure arm band on, uncluttered walking paths in room, adequate room lighting, call light and overbed table within reach, bed in low position, wheels locked, side rails up per policy, and non-skid footwear provided.   07/22/2022 1407 by Raoul Pitch, RN  Outcome: Completed  07/22/2022 1031 by Raoul Pitch, RN  Outcome: Progressing  Goal: Patient with weight > 350lbs will have appropriate equipment  Description: Consider ordering Bariatric Bed, Chair and Bedside Commode for patient weight > 350 lbs.  07/22/2022 1407 by Raoul Pitch, RN  Outcome: Completed  07/22/2022 1031 by Raoul Pitch, RN  Outcome: Progressing     Problem: Patient will remain free of falls  Goal: Universal Fall Precautions  07/22/2022 1407 by Raoul Pitch, RN  Outcome: Completed  07/22/2022 1031 by Raoul Pitch, RN  Outcome: Progressing     Problem: Daily Care  Goal: Daily care needs are met  Description: Assess and monitor ability to perform self care and identify potential discharge needs.  07/22/2022 1407 by Raoul Pitch, RN  Outcome: Completed  07/22/2022 1031 by Raoul Pitch, RN  Outcome: Progressing     Problem: Psychosocial Needs  Goal: Demonstrates ability to cope with hospitalization/illness  Description: Assess and monitor patients ability to cope with his/her illness.  07/22/2022 1407 by Raoul Pitch, RN  Outcome: Completed  07/22/2022 1031 by Raoul Pitch, RN  Outcome: Progressing     Problem: Patient will remain free of injury d/t fall  Goal: High Fall Risk Precautions  07/22/2022 1407 by Raoul Pitch, RN  Outcome: Completed  07/22/2022 1031 by Raoul Pitch, RN  Outcome: Progressing  Goal:  Additional High Fall Risk Precautions (consider the following)  07/22/2022 1407 by Raoul Pitch, RN  Outcome: Completed  07/22/2022 1031 by Raoul Pitch, RN  Outcome: Progressing     Problem: Knowledge Deficit  Goal: Patient/family/caregiver demonstrates understanding of disease process, treatment plan, medications, and discharge instructions  Description: Complete learning assessment and assess knowledge base.  07/22/2022 1407 by Raoul Pitch, RN  Outcome: Completed  07/22/2022 1031 by Raoul Pitch, RN  Outcome: Progressing     Problem: Potential for Compromised Skin Integrity  Goal: Skin integrity is maintained or improved  Description: Assess and monitor skin integrity. Identify patients at risk for skin breakdown on admission and per policy. Collaborate with interdisciplinary team and initiate plans and interventions as needed.  07/22/2022 1407 by Raoul Pitch, RN  Outcome: Completed  07/22/2022 1031 by Raoul Pitch, RN  Outcome: Progressing  Goal: Nutritional status is improving  Description: Monitor and assess patient for malnutrition (ex- brittle hair, bruises, dry skin, pale skin and conjunctiva, muscle wasting, smooth red tongue, and disorientation). Collaborate with interdisciplinary team and initiate plan and interventions as ordered.  Monitor patient's weight and dietary intake as ordered or per policy. Utilize nutrition screening tool and intervene per policy. Determine patient's food preferences and provide high-protein, high-caloric foods as appropriate.   07/22/2022 1407 by Raoul Pitch, RN  Outcome: Completed  07/22/2022 1031 by Raoul Pitch, RN  Outcome: Progressing     Problem: Incontinence  Goal: Perineal skin integrity is maintained or improved  Description: Assess genitourinary system, perineal skin, labs (urinalysis), and history of incontinence to include past management, aggravating, and alleviating factors.  Collaborate with interdisciplinary team and initiate plans  and interventions as needed.  07/22/2022 1407 by Raoul Pitch, RN  Outcome: Completed  07/22/2022 1031 by Raoul Pitch, RN  Outcome: Progressing     Problem: Acute Pain  Description: Patient's pain progressing toward patient's stated pain goal  Goal: Patient displays improved well-being such as baseline levels for pulse, BP, respirations and relaxed muscle tone or body posture  07/22/2022 1407 by Raoul Pitch, RN  Outcome: Completed  07/22/2022 1031 by Raoul Pitch, RN  Outcome: Progressing  Goal: Patient will manage pain with the appropriate technique/intervention  Description: Assess and monitor patient's pain using appropriate pain scale. Collaborate with interdisciplinary team and initiate plan and interventions as ordered.  Re-assess patient's pain level 30-60 minutes after pain management intervention.  07/22/2022 1407 by Raoul Pitch, RN  Outcome: Completed  07/22/2022 1031 by Raoul Pitch, RN  Outcome: Progressing  Goal: Patient will reduce or eliminate use of analgesics  07/22/2022 1407 by Raoul Pitch, RN  Outcome: Completed  07/22/2022 1031 by Raoul Pitch, RN  Outcome: Progressing  Goal: Patients pain is managed to allow active participation in daily activities  07/22/2022 1407 by Raoul Pitch, RN  Outcome: Completed  07/22/2022 1031 by Raoul Pitch, RN  Outcome: Progressing  Goal: Patient verbalizes a reduction in pain level  07/22/2022 1407 by Raoul Pitch, RN  Outcome: Completed  07/22/2022 1031 by Raoul Pitch, RN  Outcome: Progressing  Goal: Discharge Pain Management Plan (Acute Pain)  07/22/2022 1407 by Raoul Pitch, RN  Outcome: Completed  07/22/2022 1031 by Raoul Pitch, RN  Outcome: Progressing     Problem: Chronic Pain  Description: Patient's pain progressing toward patient's stated pain goal  Goal: Pt will have limited adverse effects related to chronic pain  Description: i.e. Depression, opioid induced constipation, respiratory depression  07/22/2022  1407 by Raoul Pitch, RN  Outcome: Completed  07/22/2022 1031 by Raoul Pitch, RN  Outcome: Progressing  Goal: Patient will manage pain with the appropriate technique/intervention  Description: Assess and monitor patient's pain using appropriate pain scale. Collaborate with interdisciplinary team and initiate plan and interventions as ordered.  Re-assess patient's pain level 30-60 minutes after pain management intervention.  07/22/2022 1407 by Raoul Pitch, RN  Outcome: Completed  07/22/2022 1031 by Raoul Pitch, RN  Outcome: Progressing  Goal: Patient will reduce or eliminate use of analgesics  07/22/2022 1407 by Raoul Pitch, RN  Outcome: Completed  07/22/2022 1031 by Raoul Pitch, RN  Outcome: Progressing  Goal: Patients pain is managed to allow active participation in daily activities  07/22/2022 1407 by Raoul Pitch, RN  Outcome: Completed  07/22/2022 1031 by Raoul Pitch, RN  Outcome: Progressing  Goal: Patient verbalizes a reduction in pain level  07/22/2022 1407 by Raoul Pitch, RN  Outcome: Completed  07/22/2022 1031 by Raoul Pitch, RN  Outcome: Progressing  Goal: Discharge Pain Management Plan (Chronic Pain)  07/22/2022 1407 by Raoul Pitch, RN  Outcome: Completed  07/22/2022 1031 by Raoul Pitch, RN  Outcome: Progressing     Problem: Inadequate Gas Exchange  Goal: Patient is adequately oxygenated and ventilation is improved  Description: Assess and monitor vital signs, oxygen saturation, respiratory status to include rate, depth, effort, and lung sounds, mental status, cyanosis, and labs (ABG's).  Monitor effects of medications that may sedate the patient.  Collaborate with respiratory therapy to administer medications and treatments.  07/22/2022 1407 by Raoul Pitch, RN  Outcome: Completed  07/22/2022 1031 by Raoul Pitch, RN  Outcome: Progressing  Problem: Discharge Planning  Goal: Identify discharge needs  07/22/2022 1407 by Raoul Pitch, RN  Outcome:  Completed  07/22/2022 1031 by Raoul Pitch, RN  Outcome: Progressing

## 2022-07-22 NOTE — Nursing Note (Signed)
Report called and given to Antony Contras RN at [facility]. All questions addressed. Pt discharging in good stable condition.

## 2022-07-22 NOTE — Plan of Care (Signed)
Problem: Safety  Goal: Patient will be injury free during hospitalization  Description: Assess and monitor vitals signs, neurological status including level of consciousness and orientation. Assess patient's risk for falls and implement fall prevention plan of care and interventions per hospital policy.      Ensure arm band on, uncluttered walking paths in room, adequate room lighting, call light and overbed table within reach, bed in low position, wheels locked, side rails up per policy, and non-skid footwear provided.   Outcome: Progressing  Goal: Patient with weight > 350lbs will have appropriate equipment  Description: Consider ordering Bariatric Bed, Chair and Bedside Commode for patient weight > 350 lbs.  Outcome: Progressing     Problem: Patient will remain free of falls  Goal: Universal Fall Precautions  Outcome: Progressing     Problem: Daily Care  Goal: Daily care needs are met  Description: Assess and monitor ability to perform self care and identify potential discharge needs.  Outcome: Progressing     Problem: Psychosocial Needs  Goal: Demonstrates ability to cope with hospitalization/illness  Description: Assess and monitor patients ability to cope with his/her illness.  Outcome: Progressing     Problem: Patient will remain free of injury d/t fall  Goal: High Fall Risk Precautions  Outcome: Progressing  Goal: Additional High Fall Risk Precautions (consider the following)  Outcome: Progressing     Problem: Knowledge Deficit  Goal: Patient/family/caregiver demonstrates understanding of disease process, treatment plan, medications, and discharge instructions  Description: Complete learning assessment and assess knowledge base.  Outcome: Progressing     Problem: Potential for Compromised Skin Integrity  Goal: Skin integrity is maintained or improved  Description: Assess and monitor skin integrity. Identify patients at risk for skin breakdown on admission and per policy. Collaborate with interdisciplinary  team and initiate plans and interventions as needed.  Outcome: Progressing  Goal: Nutritional status is improving  Description: Monitor and assess patient for malnutrition (ex- brittle hair, bruises, dry skin, pale skin and conjunctiva, muscle wasting, smooth red tongue, and disorientation). Collaborate with interdisciplinary team and initiate plan and interventions as ordered.  Monitor patient's weight and dietary intake as ordered or per policy. Utilize nutrition screening tool and intervene per policy. Determine patient's food preferences and provide high-protein, high-caloric foods as appropriate.   Outcome: Progressing     Problem: Incontinence  Goal: Perineal skin integrity is maintained or improved  Description: Assess genitourinary system, perineal skin, labs (urinalysis), and history of incontinence to include past management, aggravating, and alleviating factors.  Collaborate with interdisciplinary team and initiate plans and interventions as needed.  Outcome: Progressing     Problem: Acute Pain  Description: Patient's pain progressing toward patient's stated pain goal  Goal: Patient displays improved well-being such as baseline levels for pulse, BP, respirations and relaxed muscle tone or body posture  Outcome: Progressing  Goal: Patient will manage pain with the appropriate technique/intervention  Description: Assess and monitor patient's pain using appropriate pain scale. Collaborate with interdisciplinary team and initiate plan and interventions as ordered.  Re-assess patient's pain level 30-60 minutes after pain management intervention.  Outcome: Progressing  Goal: Patient will reduce or eliminate use of analgesics  Outcome: Progressing  Goal: Patients pain is managed to allow active participation in daily activities  Outcome: Progressing  Goal: Patient verbalizes a reduction in pain level  Outcome: Progressing  Goal: Discharge Pain Management Plan (Acute Pain)  Outcome: Progressing     Problem:  Chronic Pain  Description: Patient's pain progressing toward patient's stated  pain goal  Goal: Pt will have limited adverse effects related to chronic pain  Description: i.e. Depression, opioid induced constipation, respiratory depression  Outcome: Progressing  Goal: Patient will manage pain with the appropriate technique/intervention  Description: Assess and monitor patient's pain using appropriate pain scale. Collaborate with interdisciplinary team and initiate plan and interventions as ordered.  Re-assess patient's pain level 30-60 minutes after pain management intervention.  Outcome: Progressing  Goal: Patient will reduce or eliminate use of analgesics  Outcome: Progressing  Goal: Patients pain is managed to allow active participation in daily activities  Outcome: Progressing  Goal: Patient verbalizes a reduction in pain level  Outcome: Progressing  Goal: Discharge Pain Management Plan (Chronic Pain)  Outcome: Progressing     Problem: Inadequate Gas Exchange  Goal: Patient is adequately oxygenated and ventilation is improved  Description: Assess and monitor vital signs, oxygen saturation, respiratory status to include rate, depth, effort, and lung sounds, mental status, cyanosis, and labs (ABG's).  Monitor effects of medications that may sedate the patient.  Collaborate with respiratory therapy to administer medications and treatments.  Outcome: Progressing     Problem: Discharge Planning  Goal: Identify discharge needs  Outcome: Progressing

## 2022-07-24 NOTE — Telephone Encounter (Signed)
Reached out to care facility that patient was discharged too, [facility] Provider Contact Number: Carley Hammed [phone] to attempt to set up cardiac event monitor. Carley Hammed took out number and would like to look into if we are able to send event monitor to them to be placed while there. They are set to have their intake meeting on patient next Tuesday. Delayed reminder to diagnostic department to call back after Tuesday to check patients discharge plan.

## 2022-07-27 NOTE — Telephone Encounter (Signed)
Spoke with bedside nurse [facility] and she requests a fax of the Pathology report to: [phone]. Will ask my clinic staff to fax the Pathology report.

## 2022-07-27 NOTE — Telephone Encounter (Signed)
Called Michele Hopkins at [phone]. Currently admitted to [facility] for hypotension.    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 Provided opportunity to ask questions: she asked me if it is a fast moving cancer. I was not able to tell her, and explained that my specialty is Neurology. The Oncologists can give her a better idea. She asked if the cancer is in her lymph nodes. I explained that the biopsy is from a lymph node, and therefore the cancer is in her lymph nodes.    She asked me to reach out to her treating team. I called [facility] 07/27/22 at 11:48 AM  [phone] and left a message for bedside nurse to call me back.

## 2022-07-31 NOTE — Telephone Encounter (Signed)
Attempted to call back patients care facility that she was discharged to in order to set up the patients cardiac event monitor that was ordered for her. Had to leave message on asking for a call back to [phone]. See previous phone message for location and contact number.

## 2022-08-03 ENCOUNTER — Ambulatory Visit: Payer: Medicare (Managed Care)

## 2022-08-07 NOTE — Telephone Encounter (Signed)
Final attempt to reach facility to set up event monitor.  Voice mail left  on Michele Hopkins at [phone] asking for a return call to [phone] to get this set up for the patient.

## 2022-10-15 ENCOUNTER — Ambulatory Visit: Payer: Medicare (Managed Care) | Attending: Critical Care Medicine

## 2022-11-29 DEATH — deceased
